# Patient Record
Sex: Female | Born: 1955
Health system: Southern US, Community
[De-identification: ages and names within clinical notes are randomized; demographics above are authoritative.]

## PROBLEM LIST (undated history)

## (undated) DIAGNOSIS — H02831 Dermatochalasis of right upper eyelid: Secondary | ICD-10-CM

## (undated) DIAGNOSIS — F17200 Nicotine dependence, unspecified, uncomplicated: Secondary | ICD-10-CM

## (undated) DIAGNOSIS — H02834 Dermatochalasis of left upper eyelid: Secondary | ICD-10-CM

## (undated) DIAGNOSIS — H401131 Primary open-angle glaucoma, bilateral, mild stage: Secondary | ICD-10-CM

## (undated) HISTORY — DX: Primary open-angle glaucoma, bilateral, mild stage: H40.1131

## (undated) HISTORY — DX: Dermatochalasis of left upper eyelid: H02.834

## (undated) HISTORY — DX: Nicotine dependence, unspecified, uncomplicated: F17.200

## (undated) HISTORY — DX: Dermatochalasis of right upper eyelid: H02.831

---

## 2000-01-01 HISTORY — PX: ANTERIOR CERVICAL DISCECTOMY: SHX1160

## 2017-02-01 DIAGNOSIS — Z969 Presence of functional implant, unspecified: Secondary | ICD-10-CM | POA: Diagnosis not present

## 2017-02-01 DIAGNOSIS — Z8781 Personal history of (healed) traumatic fracture: Secondary | ICD-10-CM | POA: Diagnosis not present

## 2017-02-01 DIAGNOSIS — M25561 Pain in right knee: Secondary | ICD-10-CM | POA: Diagnosis not present

## 2017-02-06 DIAGNOSIS — H52223 Regular astigmatism, bilateral: Secondary | ICD-10-CM | POA: Diagnosis not present

## 2017-02-06 DIAGNOSIS — H5203 Hypermetropia, bilateral: Secondary | ICD-10-CM | POA: Diagnosis not present

## 2017-08-13 DIAGNOSIS — K080 Exfoliation of teeth due to systemic causes: Secondary | ICD-10-CM | POA: Diagnosis not present

## 2017-08-20 DIAGNOSIS — K080 Exfoliation of teeth due to systemic causes: Secondary | ICD-10-CM | POA: Diagnosis not present

## 2017-10-04 DIAGNOSIS — H401111 Primary open-angle glaucoma, right eye, mild stage: Secondary | ICD-10-CM | POA: Diagnosis not present

## 2017-10-23 DIAGNOSIS — K080 Exfoliation of teeth due to systemic causes: Secondary | ICD-10-CM | POA: Diagnosis not present

## 2017-10-28 DIAGNOSIS — K080 Exfoliation of teeth due to systemic causes: Secondary | ICD-10-CM | POA: Diagnosis not present

## 2017-11-11 DIAGNOSIS — K080 Exfoliation of teeth due to systemic causes: Secondary | ICD-10-CM | POA: Diagnosis not present

## 2017-11-15 DIAGNOSIS — H401111 Primary open-angle glaucoma, right eye, mild stage: Secondary | ICD-10-CM | POA: Diagnosis not present

## 2017-11-19 DIAGNOSIS — K080 Exfoliation of teeth due to systemic causes: Secondary | ICD-10-CM | POA: Diagnosis not present

## 2018-01-08 DIAGNOSIS — K080 Exfoliation of teeth due to systemic causes: Secondary | ICD-10-CM | POA: Diagnosis not present

## 2018-01-28 DIAGNOSIS — K080 Exfoliation of teeth due to systemic causes: Secondary | ICD-10-CM | POA: Diagnosis not present

## 2018-02-05 DIAGNOSIS — H401111 Primary open-angle glaucoma, right eye, mild stage: Secondary | ICD-10-CM | POA: Diagnosis not present

## 2018-03-12 DIAGNOSIS — K080 Exfoliation of teeth due to systemic causes: Secondary | ICD-10-CM | POA: Diagnosis not present

## 2018-03-18 DIAGNOSIS — H401111 Primary open-angle glaucoma, right eye, mild stage: Secondary | ICD-10-CM | POA: Diagnosis not present

## 2018-04-09 DIAGNOSIS — H02834 Dermatochalasis of left upper eyelid: Secondary | ICD-10-CM | POA: Diagnosis not present

## 2018-04-09 DIAGNOSIS — H2513 Age-related nuclear cataract, bilateral: Secondary | ICD-10-CM | POA: Diagnosis not present

## 2018-04-09 DIAGNOSIS — H02831 Dermatochalasis of right upper eyelid: Secondary | ICD-10-CM

## 2018-04-09 DIAGNOSIS — K080 Exfoliation of teeth due to systemic causes: Secondary | ICD-10-CM | POA: Diagnosis not present

## 2018-04-09 DIAGNOSIS — H401131 Primary open-angle glaucoma, bilateral, mild stage: Secondary | ICD-10-CM

## 2018-04-09 HISTORY — DX: Dermatochalasis of right upper eyelid: H02.831

## 2018-04-09 HISTORY — DX: Primary open-angle glaucoma, bilateral, mild stage: H40.1131

## 2018-06-10 DIAGNOSIS — H02403 Unspecified ptosis of bilateral eyelids: Secondary | ICD-10-CM | POA: Diagnosis not present

## 2018-06-10 DIAGNOSIS — H02834 Dermatochalasis of left upper eyelid: Secondary | ICD-10-CM | POA: Diagnosis not present

## 2018-06-10 DIAGNOSIS — H02831 Dermatochalasis of right upper eyelid: Secondary | ICD-10-CM | POA: Diagnosis not present

## 2018-07-16 DIAGNOSIS — K080 Exfoliation of teeth due to systemic causes: Secondary | ICD-10-CM | POA: Diagnosis not present

## 2018-07-30 ENCOUNTER — Encounter: Payer: Self-pay | Admitting: Family Medicine

## 2018-07-30 ENCOUNTER — Ambulatory Visit (INDEPENDENT_AMBULATORY_CARE_PROVIDER_SITE_OTHER): Payer: Federal, State, Local not specified - PPO | Admitting: Family Medicine

## 2018-07-30 VITALS — BP 130/78 | HR 83 | Temp 98.3°F | Wt 156.6 lb

## 2018-07-30 DIAGNOSIS — F17210 Nicotine dependence, cigarettes, uncomplicated: Secondary | ICD-10-CM

## 2018-07-30 DIAGNOSIS — R490 Dysphonia: Secondary | ICD-10-CM | POA: Diagnosis not present

## 2018-07-30 DIAGNOSIS — F17200 Nicotine dependence, unspecified, uncomplicated: Secondary | ICD-10-CM | POA: Insufficient documentation

## 2018-07-30 HISTORY — DX: Nicotine dependence, unspecified, uncomplicated: F17.200

## 2018-07-30 MED ORDER — PREDNISONE 20 MG PO TABS: 40.0000 mg | ORAL_TABLET | Freq: Every day | ORAL | 0 refills | Status: AC

## 2018-07-30 NOTE — Assessment & Plan Note (Signed)
Down to 5 cigs per day, is also vaping.  Counseled on quitting

## 2018-07-30 NOTE — Progress Notes (Signed)
Debra Pena - 62 y.o. female MRN [id number]  Date of birth: July 18, 1956  Subjective Chief Complaint  Patient presents with  . Hoarse    last thursday became hoarse, not pain or sickness    HPI Debra Pena is a 62 y.o. female who is fairly healthy here today with complaint of hoarseness.  She reports onset of symptoms around 7-10 days ago.  Symptoms were fairly sudden in onset.  She denies any preceding or current illness.  She has not had any reflux symptoms or problems with allergies/congestion.  She does have a new grandchild that she has been around and when the baby screams or coos she will try to do it louder as kind of a game they play.  She is a smoker but has cut back to about 5 cigarettes per day, she also vapes which has helped her cut back on cigarette usage. She denies chronic cough associated with this, fever, chills or difficulty swallowing.   ROS:  A comprehensive ROS was completed and negative except as noted per HPI  No Known Allergies  Past Medical History:  Diagnosis Date  . Dermatochalasis of both upper eyelids 04/09/2018  . Nicotine dependence 07/30/2018  . Primary open angle glaucoma (POAG) of both eyes, mild stage 04/09/2018    Past Surgical History:  Procedure Laterality Date  . ANTERIOR CERVICAL DISCECTOMY  2001    Social History   Socioeconomic History  . Marital status: Married    Spouse name: Not on file  . Number of children: Not on file  . Years of education: Not on file  . Highest education level: Not on file  Occupational History  . Not on file  Social Needs  . Financial resource strain: Not on file  . Food insecurity:    Worry: Not on file    Inability: Not on file  . Transportation needs:    Medical: Not on file    Non-medical: Not on file  Tobacco Use  . Smoking status: Current Every Day Smoker    Packs/day: 0.25    Types: Cigarettes  . Smokeless tobacco: Never Used  Substance and Sexual Activity  . Alcohol use: Never   Frequency: Never  . Drug use: Never  . Sexual activity: Yes  Lifestyle  . Physical activity:    Days per week: Not on file    Minutes per session: Not on file  . Stress: Not on file  Relationships  . Social connections:    Talks on phone: Not on file    Gets together: Not on file    Attends religious service: Not on file    Active member of club or organization: Not on file    Attends meetings of clubs or organizations: Not on file    Relationship status: Not on file  Other Topics Concern  . Not on file  Social History Narrative  . Not on file    Family History  Problem Relation Age of Onset  . COPD Mother   . Heart disease Mother   . Hypertension Mother   . Hypertension Father   . Hyperlipidemia Father   . Diabetes Father   . Depression Sister   . Cancer Brother        Prostate  . Hypertension Son   . Hypertension Sister     Health Maintenance  Topic Date Due  . Hepatitis C Screening  1956-10-30  . HIV Screening  02/10/1969  . PAP SMEAR  02/10/1975  . MAMMOGRAM  02/11/2004  . INFLUENZA VACCINE  07/31/2018  . COLONOSCOPY  07/08/2021  . TETANUS/TDAP  07/17/2027    ----------------------------------------------------------------------------------------------------------------------------------------------------------------------------------------------------------------- Physical Exam BP 130/78 (BP Location: Right Arm, Patient Position: Sitting, Cuff Size: Normal)   Pulse 83   Temp 98.3 F (36.8 C) (Oral)   Wt 156 lb 9.6 oz (71 kg)   SpO2 96%   Physical Exam  Constitutional: She is oriented to person, place, and time. She appears well-nourished. No distress.  HENT:  Head: Normocephalic and atraumatic.  Mouth/Throat: Oropharynx is clear and moist.  Mild uvular edema.   Eyes: No scleral icterus.  Neck: No thyromegaly present.  Cardiovascular: Normal rate, regular rhythm and normal heart sounds.  Pulmonary/Chest: Effort normal and breath sounds normal.    Lymphadenopathy:    She has no cervical adenopathy.  Neurological: She is alert and oriented to person, place, and time.  Skin: Skin is warm and dry. No rash noted.  Psychiatric: She has a normal mood and affect. Her behavior is normal.    ------------------------------------------------------------------------------------------------------------------------------------------------------------------------------------------------------------------- Assessment and Plan  Hoarseness Denies symptoms of reflux/allergies Trial of prednisone If not improving with this I recommend that she see ENT with history of chronic smoking.   She will also return at her convenience for CPE.   Nicotine dependence Down to 5 cigs per day, is also vaping.  Counseled on quitting

## 2018-07-30 NOTE — Assessment & Plan Note (Signed)
Denies symptoms of reflux/allergies Trial of prednisone If not improving with this I recommend that she see ENT with history of chronic smoking.   She will also return at her convenience for CPE.

## 2018-07-30 NOTE — Patient Instructions (Signed)
Hoarseness Hoarseness is any abnormal change in your voice.Hoarseness can make it difficult to speak. Your voice may sound raspy, breathy, or strained. Hoarseness is caused by a problem with the vocal cords. The vocal cords are two bands of tissue inside your voice box (larynx). When you speak, your vocal cords move back and forth to create sound. The surfaces of your vocal cords need to be smooth for your voice to sound clear. Swelling or lumps on the vocal cords can cause hoarseness. Common causes of vocal cord problems include:  Upper airway infection.  A long-term cough.  Straining or overusing your voice.  Smoking.  Allergies.  Vocal cord growths.  Stomach acids that flow up from your stomach and irritate your vocal cords (gastroesophageal reflux).  Follow these instructions at home: Watch your condition for any changes. To ease any discomfort that you feel:  Rest your voice. Do not whisper. Whispering can cause muscle strain.  Do not speak in a loud or harsh voice that makes your hoarseness worse.  Do not use any tobacco products, including cigarettes, chewing tobacco, or electronic cigarettes. If you need help quitting, ask your health care provider.  Avoid secondhand smoke.  Do not eat foods that give you heartburn. Heartburn can make gastroesophageal reflux worse.  Do not drink coffee.  Do not drink alcohol.  Drink enough fluids to keep your urine clear or pale yellow.  Use a humidifier if the air in your home is dry.  Contact a health care provider if:  You have hoarseness that lasts longer than 3 weeks.  You almost lose or completelylose your voice for longer than 3 days.  You have pain when you swallow or try to talk.  You feel a lump in your neck. Get help right away if:  You have trouble swallowing.  You feel as though you are choking when you swallow.  You cough up blood or vomit blood.  You have trouble breathing. This information is not  intended to replace advice given to you by your health care provider. Make sure you discuss any questions you have with your health care provider. Document Released: 11/30/2005 Document Revised: 05/24/2016 Document Reviewed: 12/08/2014 Elsevier Interactive Patient Education  2018 Elsevier Inc.  

## 2018-09-15 DIAGNOSIS — Z87891 Personal history of nicotine dependence: Secondary | ICD-10-CM | POA: Diagnosis not present

## 2018-09-15 DIAGNOSIS — H02834 Dermatochalasis of left upper eyelid: Secondary | ICD-10-CM | POA: Diagnosis not present

## 2018-09-15 DIAGNOSIS — H409 Unspecified glaucoma: Secondary | ICD-10-CM | POA: Diagnosis not present

## 2018-09-15 DIAGNOSIS — H02831 Dermatochalasis of right upper eyelid: Secondary | ICD-10-CM | POA: Diagnosis not present

## 2018-10-01 DIAGNOSIS — H02831 Dermatochalasis of right upper eyelid: Secondary | ICD-10-CM | POA: Diagnosis not present

## 2018-10-01 DIAGNOSIS — H401131 Primary open-angle glaucoma, bilateral, mild stage: Secondary | ICD-10-CM | POA: Diagnosis not present

## 2018-10-01 DIAGNOSIS — H2513 Age-related nuclear cataract, bilateral: Secondary | ICD-10-CM | POA: Diagnosis not present

## 2018-10-01 DIAGNOSIS — H02834 Dermatochalasis of left upper eyelid: Secondary | ICD-10-CM | POA: Diagnosis not present

## 2018-10-22 DIAGNOSIS — K080 Exfoliation of teeth due to systemic causes: Secondary | ICD-10-CM | POA: Diagnosis not present

## 2019-02-02 ENCOUNTER — Ambulatory Visit (INDEPENDENT_AMBULATORY_CARE_PROVIDER_SITE_OTHER): Payer: Federal, State, Local not specified - PPO

## 2019-02-02 ENCOUNTER — Ambulatory Visit (INDEPENDENT_AMBULATORY_CARE_PROVIDER_SITE_OTHER): Payer: Federal, State, Local not specified - PPO | Admitting: Family Medicine

## 2019-02-02 ENCOUNTER — Encounter: Payer: Self-pay | Admitting: Family Medicine

## 2019-02-02 VITALS — BP 138/84 | HR 82 | Temp 97.7°F | Ht 63.0 in | Wt 159.4 lb

## 2019-02-02 DIAGNOSIS — M5412 Radiculopathy, cervical region: Secondary | ICD-10-CM | POA: Diagnosis not present

## 2019-02-02 DIAGNOSIS — M542 Cervicalgia: Secondary | ICD-10-CM | POA: Diagnosis not present

## 2019-02-02 MED ORDER — METHYLPREDNISOLONE 4 MG PO TBPK: ORAL_TABLET | ORAL | 0 refills | Status: DC

## 2019-02-02 NOTE — Patient Instructions (Signed)
Cervical Radiculopathy    Cervical radiculopathy means that a nerve in the neck is pinched or bruised. This can cause pain or loss of feeling (numbness) that runs from your neck to your arm and fingers.  Follow these instructions at home:  Managing pain  · Take over-the-counter and prescription medicines only as told by your doctor.  · If directed, put ice on the injured or painful area.  ? Put ice in a plastic bag.  ? Place a towel between your skin and the bag.  ? Leave the ice on for 20 minutes, 2-3 times per day.  · If ice does not help, you can try using heat. Take a warm shower or warm bath, or use a heat pack as told by your doctor.  · You may try a gentle neck and shoulder massage.  Activity  · Rest as needed. Follow instructions from your doctor about any activities to avoid.  · Do exercises as told by your doctor or physical therapist.  General instructions  · If you were given a soft collar, wear it as told by your doctor.  · Use a flat pillow when you sleep.  · Keep all follow-up visits as told by your doctor. This is important.  Contact a doctor if:  · Your condition does not improve with treatment.  Get help right away if:  · Your pain gets worse and is not controlled with medicine.  · You lose feeling or feel weak in your hand, arm, face, or leg.  · You have a fever.  · You have a stiff neck.  · You cannot control when you poop or pee (have incontinence).  · You have trouble with walking, balance, or talking.  This information is not intended to replace advice given to you by your health care provider. Make sure you discuss any questions you have with your health care provider.  Document Released: 12/06/2011 Document Revised: 05/24/2016 Document Reviewed: 02/10/2015  Elsevier Interactive Patient Education © 2019 Elsevier Inc.

## 2019-02-02 NOTE — Assessment & Plan Note (Signed)
-  Medrol dosepak and referral to PT -Imaging ordered.

## 2019-02-02 NOTE — Progress Notes (Addendum)
Debra Pena - 63 y.o. female MRN [id number]  Date of birth: Aug 07, 1956  Subjective Chief Complaint  Patient presents with  . Pain    pain in right arm/ 4 days/ 20 years ago had herniated disc pt states feels like same pain    HPI Debra Pena is a 63 y.o. female here today with complaint of neck pain with radiation into the R arm with numbness and tingling to the first digit and thumb.  She report that this started about 4 days ago after lifting her grandson.  She reports that this feels similar to when she had disc herniation that required surgery 20 years ago.  She denies significant weakness.  She has not tried anything for treatment so far.   ROS:  A comprehensive ROS was completed and negative except as noted per HPI  No Known Allergies  Past Medical History:  Diagnosis Date  . Dermatochalasis of both upper eyelids 04/09/2018  . Nicotine dependence 07/30/2018  . Primary open angle glaucoma (POAG) of both eyes, mild stage 04/09/2018    Past Surgical History:  Procedure Laterality Date  . ANTERIOR CERVICAL DISCECTOMY  2001    Social History   Socioeconomic History  . Marital status: Married    Spouse name: Not on file  . Number of children: Not on file  . Years of education: Not on file  . Highest education level: Not on file  Occupational History  . Not on file  Social Needs  . Financial resource strain: Not on file  . Food insecurity:    Worry: Not on file    Inability: Not on file  . Transportation needs:    Medical: Not on file    Non-medical: Not on file  Tobacco Use  . Smoking status: Current Every Day Smoker    Packs/day: 0.25    Types: Cigarettes  . Smokeless tobacco: Never Used  Substance and Sexual Activity  . Alcohol use: Never    Frequency: Never  . Drug use: Never  . Sexual activity: Yes  Lifestyle  . Physical activity:    Days per week: Not on file    Minutes per session: Not on file  . Stress: Not on file  Relationships  . Social  connections:    Talks on phone: Not on file    Gets together: Not on file    Attends religious service: Not on file    Active member of club or organization: Not on file    Attends meetings of clubs or organizations: Not on file    Relationship status: Not on file  Other Topics Concern  . Not on file  Social History Narrative  . Not on file    Family History  Problem Relation Age of Onset  . COPD Mother   . Heart disease Mother   . Hypertension Mother   . Hypertension Father   . Hyperlipidemia Father   . Diabetes Father   . Depression Sister   . Cancer Brother        Prostate  . Hypertension Son   . Hypertension Sister     Health Maintenance  Topic Date Due  . Hepatitis C Screening  07/15/1956  . HIV Screening  02/10/1969  . PAP SMEAR-Modifier  02/10/1975  . MAMMOGRAM  02/11/2004  . COLONOSCOPY  07/08/2021  . TETANUS/TDAP  07/17/2027  . INFLUENZA VACCINE  Completed    ----------------------------------------------------------------------------------------------------------------------------------------------------------------------------------------------------------------- Physical Exam BP 138/84   Pulse 82   Temp 97.7 F (36.5 C) (  Oral)   Ht 5\' 3"  (1.6 m)   Wt 159 lb 6.4 oz (72.3 kg)   SpO2 97%   BMI 28.24 kg/m   Physical Exam Constitutional:      Appearance: Normal appearance.  HENT:     Head: Normocephalic and atraumatic.  Eyes:     General: No scleral icterus. Neck:     Comments: ROM limited with R rotation with +spurling on R Cardiovascular:     Rate and Rhythm: Normal rate and regular rhythm.  Pulmonary:     Effort: Pulmonary effort is normal.     Breath sounds: Normal breath sounds.  Skin:    General: Skin is warm and dry.     Findings: No rash.  Neurological:     Mental Status: She is alert.     Comments: Mild weakness of intrinsic muscles of thumb and first digit.  Otherwise normal.   Psychiatric:        Mood and Affect: Mood normal.         Behavior: Behavior normal.     ------------------------------------------------------------------------------------------------------------------------------------------------------------------------------------------------------------------- Assessment and Plan  Cervical radiculopathy -Medrol dosepak and referral to PT -Imaging ordered.

## 2019-02-03 ENCOUNTER — Ambulatory Visit: Payer: Federal, State, Local not specified - PPO | Admitting: Physical Therapy

## 2019-02-03 ENCOUNTER — Other Ambulatory Visit: Payer: Self-pay

## 2019-02-03 ENCOUNTER — Encounter: Payer: Self-pay | Admitting: Physical Therapy

## 2019-02-03 DIAGNOSIS — R293 Abnormal posture: Secondary | ICD-10-CM

## 2019-02-03 DIAGNOSIS — M5412 Radiculopathy, cervical region: Secondary | ICD-10-CM

## 2019-02-03 NOTE — Therapy (Signed)
[facility] [address] [place], [place], [address] Phone: [phone]   Fax:  [phone]  Physical Therapy Evaluation  Patient Details  Name: Debra Pena MRN: [id number] Date of Birth: 05-20-1956 Referring Provider (PT): Everrett CoombeMatthews, Cody, DO   Encounter Date: 02/03/2019  PT End of Session - 02/03/19 1154    Visit Number  1    Number of Visits  12    Date for PT Re-Evaluation  03/17/19    Authorization Type  [employer]    Authorization - Number of Visits  75    PT Start Time  1115    PT Stop Time  1150    PT Time Calculation (min)  35 min    Activity Tolerance  Patient tolerated treatment well    Behavior During Therapy  [facility] for tasks assessed/performed       Past Medical History:  Diagnosis Date  . Dermatochalasis of both upper eyelids 04/09/2018  . Nicotine dependence 07/30/2018  . Primary open angle glaucoma (POAG) of both eyes, mild stage 04/09/2018    Past Surgical History:  Procedure Laterality Date  . ANTERIOR CERVICAL DISCECTOMY  2001    There were no vitals filed for this visit.   Subjective Assessment - 02/03/19 1117    Subjective  Pt is a 63 y/o female who presents to OPPT with sudden onset of Rt sided neck pain radiating into Rt hand, similar to pain experienced with previous herniated disc.  Pt feels symptoms may be due to lifting 25# grandson.      Pertinent History  surgical repair of herniated cervical disc (2000)    Diagnostic tests  xrays: arthritis    Patient Stated Goals  improve pain, work on strength/proper lifting techniques    Currently in Pain?  Yes    Pain Score  0-No pain   up to 8/10   Pain Location  Neck    Pain Orientation  Right;Posterior    Pain Descriptors / Indicators  Shooting;Tingling    Pain Type  Acute pain    Pain Radiating Towards  Rt UE; some numbness into pointer fingertip    Pain Onset  In the past 7 days    Pain Frequency  Intermittent    Aggravating  Factors   rt rotation, lying on Lt side    Pain Relieving Factors  prednisone, aleve, ibprofen, heat/ice         [facility] - 02/03/19 1121      Assessment   Medical Diagnosis  M54.12 (ICD-10-CM) - Cervical radiculopathy    Referring Provider (PT)  Everrett CoombeMatthews, Cody, DO    Onset Date/Surgical Date  01/29/19    Hand Dominance  Right    Next MD Visit  return if no improvement    Prior Therapy  none      Precautions   Precautions  None      Restrictions   Weight Bearing Restrictions  No      Balance Screen   Has the patient fallen in the past 6 months  No    Has the patient had a decrease in activity level because of a fear of falling?   No    Is the patient reluctant to leave their home because of a fear of falling?   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    Leisure  knitting, English as a second language teacher, spend time with grandchildren (65 y/o and 1 y/o); no regular exercise      Cognition   Overall Cognitive Status  Within Functional Limits for tasks assessed      Observation/Other Assessments   Focus on Therapeutic Outcomes (FOTO)   58 (42% limited; predicted 29% limited)      Posture/Postural Control   Posture/Postural Control  Postural limitations    Postural Limitations  Rounded Shoulders;Forward head;Increased thoracic kyphosis      ROM / Strength   AROM / PROM / Strength  AROM;Strength      AROM   AROM Assessment Site  Cervical    Cervical Flexion  53    Cervical Extension  35    Cervical - Right Side Bend  24   with pain   Cervical - Left Side Bend  32    Cervical - Right Rotation  42   min pain   Cervical - Left Rotation  62      Strength    Overall Strength Comments  no pain with strength testing    Strength Assessment Site  Shoulder;Elbow    Right/Left Shoulder  Right;Left    Right Shoulder Flexion  5/5    Right Shoulder ABduction  5/5    Right Shoulder Internal Rotation  5/5    Right Shoulder External Rotation  5/5    Left Shoulder Flexion  5/5    Left Shoulder ABduction  5/5    Left Shoulder Internal Rotation  5/5    Left Shoulder External Rotation  5/5    Right/Left Elbow  Right;Left    Right Elbow Flexion  5/5    Right Elbow Extension  5/5    Left Elbow Flexion  5/5    Left Elbow Extension  5/5      Palpation   Palpation comment  trigger points and tenderness in Rt upper trap and levator scapula      Special Tests    Special Tests  Cervical    Cervical Tests  Spurling's;Dictraction      Spurling's   Findings  Negative      Distraction Test   Findngs  Negative                Objective measurements completed on examination: See above findings.      [facility] Adult PT Treatment/Exercise - 02/03/19 1121      Exercises   Exercises  Neck      Neck Exercises: Seated   Other Seated Exercise  scapular retraction x 5 rep      Neck Exercises: Supine   Neck Retraction  5 reps;5 secs      Neck Exercises: Stretches   Other Neck Stretches  mid level doorway stretch x 30 sec             PT Education - 02/03/19 1154    Education Details  HEP, DN    Person(s) Educated  Patient    Methods  Explanation;Demonstration;Handout    Comprehension  Verbalized understanding;Need further instruction;Returned demonstration          PT Long Term Goals - 02/03/19 1312      PT LONG TERM GOAL #1  Title  independent with HEP    Status  New    Target Date  03/17/19      PT LONG TERM GOAL #2   Title  report centralization of symptoms for improved function    Status  New    Target Date  03/17/19      PT LONG TERM GOAL #3   Title  improve Rt cervical rotation to at least 55 degrees without pain for  improved function    Status  New    Target Date  03/17/19      PT LONG TERM GOAL #4   Title  FOTO score improved to </= 30% limited for improved function    Status  New    Target Date  03/17/19      PT LONG TERM GOAL #5   Title  demonstrate improved postural awareness for improved function and decreased risk of reinjury    Status  New    Target Date  03/17/19             Plan - 02/03/19 1259    Clinical Impression Statement  Pt is a 63 y/o female who presents to OPPT for acute onset of Rt neck pain radiating into RUE.  Pt demonstrates decreased ROM, poor postural awareness and active trigger points affecting functional mobility.  Pt will benefit from PT to address deficits listed.    History and Personal Factors relevant to plan of care:  hx cervical decompression    Clinical Presentation  Stable    Clinical Decision Making  Low    Rehab Potential  Good    PT Frequency  2x / week    PT Duration  6 weeks    PT Treatment/Interventions  ADLs/Self Care Home Management;Cryotherapy;Ultrasound;Traction;Moist Heat;Electrical Stimulation;Functional mobility training;Neuromuscular re-education;Therapeutic exercise;Therapeutic activities;Patient/family education;Manual techniques;Dry needling;Passive range of motion;Taping    PT Next Visit Plan  review HEP, try traction, consider DN/manual therapy, posture exercises    PT Home Exercise Plan  Access Code: [email or web address]    Consulted and Agree with Plan of Care  Patient       Patient will benefit from skilled therapeutic intervention in order to improve the following deficits and impairments:  Postural dysfunction, Impaired UE functional use, Decreased range of motion, Increased muscle spasms, Increased fascial restricitons  Visit Diagnosis: Radiculopathy, cervical region - Plan: PT plan of care cert/re-cert  Abnormal posture - Plan: PT plan of care cert/re-cert     Problem List Patient Active Problem List   Diagnosis Date Noted  .  Cervical radiculopathy 02/02/2019  . Nicotine dependence 07/30/2018  . Hoarseness 07/30/2018  . Age-related nuclear cataract of both eyes 04/09/2018  . Dermatochalasis of both upper eyelids 04/09/2018  . Primary open angle glaucoma (POAG) of both eyes, mild stage 04/09/2018      Clarita Crane, PT, DPT 02/03/19 1:16 PM    [facility] [address] [place], [place], [address] Phone: [phone]   Fax:  [phone]  Name: Debra Pena MRN: [id number] Date of Birth: 07/02/1956

## 2019-02-03 NOTE — Patient Instructions (Signed)
Access Code: [email or web address]  URL: [email or web address]  Date: 02/03/2019  Prepared by: Moshe Cipro   Exercises  Supine Chin Tuck - 10 reps - 1 sets - 5 sec hold - 2x daily - 7x weekly  Doorway Pec Stretch at 90 Degrees Abduction - 3 reps - 1 sets - 30 sec hold - 1x daily - 7x weekly  Seated Scapular Retraction - 10 reps - 1 sets - 5 sec hold - 2x daily - 7x weekly  Patient Education  Acute Neck Pain Handout  Trigger Point Dry Needling

## 2019-02-05 DIAGNOSIS — K080 Exfoliation of teeth due to systemic causes: Secondary | ICD-10-CM | POA: Diagnosis not present

## 2019-02-06 ENCOUNTER — Encounter: Payer: Self-pay | Admitting: Physical Therapy

## 2019-02-06 ENCOUNTER — Ambulatory Visit: Payer: Federal, State, Local not specified - PPO | Admitting: Physical Therapy

## 2019-02-06 DIAGNOSIS — R293 Abnormal posture: Secondary | ICD-10-CM

## 2019-02-06 DIAGNOSIS — M5412 Radiculopathy, cervical region: Secondary | ICD-10-CM | POA: Diagnosis not present

## 2019-02-06 NOTE — Therapy (Signed)
[place], [place], [address] Phone: [phone]   Fax:  [phone]  Physical Therapy Treatment  Patient Details  Name: Debra Pena MRN: [id number] Date of Birth: 1956/04/21 Referring Provider (PT): Luetta Nutting, DO   Encounter Date: 02/06/2019  PT End of Session - 02/06/19 0929    Visit Number  2    Number of Visits  12    Date for PT Re-Evaluation  03/17/19    Authorization Type  [employer] - Number of Visits  24    PT Start Time  0845    PT Stop Time  0936    PT Time Calculation (min)  51 min    Activity Tolerance  Patient tolerated treatment well    Behavior During Therapy  [facility] for tasks assessed/performed       Past Medical History:  Diagnosis Date  . Dermatochalasis of both upper eyelids 04/09/2018  . Nicotine dependence 07/30/2018  . Primary open angle glaucoma (POAG) of both eyes, mild stage 04/09/2018    Past Surgical History:  Procedure Laterality Date  . ANTERIOR CERVICAL DISCECTOMY  2001    There were no vitals filed for this visit.  Subjective Assessment - 02/06/19 0845    Subjective  exercises going well; has days when neck feels better; then days when it feels a little worse    Pertinent History  surgical repair of herniated cervical disc (2000)    Patient Stated Goals  improve pain, work on strength/proper lifting techniques    Currently in Pain?  Yes    Pain Score  2     Pain Location  Arm    Pain Orientation  Upper;Posterior;Right    Pain Descriptors / Indicators  Burning;Sore    Pain Type  Neuropathic pain    Pain Onset  In the past 7 days    Pain Frequency  Intermittent    Aggravating Factors   rt rotation, lying on Lt side    Pain Relieving Factors  prednisone, aleve, ibuprofen, heat/ice         [facility] PT Assessment - 02/06/19 0859      Assessment   Medical Diagnosis  M54.12 (ICD-10-CM) - Cervical radiculopathy    Referring Provider  (PT)  Luetta Nutting, DO    Onset Date/Surgical Date  01/29/19                   [facility] Adult PT Treatment/Exercise - 02/06/19 0849      Neck Exercises: Machines for Strengthening   UBE (Upper Arm Bike)  L2 x 4 min (2' each direction)      Neck Exercises: Seated   Other Seated Exercise  scapular retraction 10 x 5 sec      Neck Exercises: Supine   Neck Retraction  10 reps;5 secs      Modalities   Modalities  Traction      Traction   Type of Traction  Cervical    Min (lbs)  10    Max (lbs)  15    Hold Time  60    Rest Time  20    Time  15      Manual Therapy   Manual Therapy  Soft tissue mobilization    Manual therapy comments  skilled palpation and monitoring of soft tissue during DN    Soft tissue mobilization  Rt upper trap into levator and cervical paraspinals; sub occipital release  Neck Exercises: Stretches   Other Neck Stretches  low and mid level doorway stretch 2x30 sec each       Trigger Point Dry Needling - 02/06/19 0928    Consent Given?  Yes    Education Handout Provided  Yes    Muscles Treated Upper Body  Upper trapezius    Upper Trapezius Response  Twitch reponse elicited;Palpable increased muscle length                PT Long Term Goals - 02/03/19 1312      PT LONG TERM GOAL #1   Title  independent with HEP    Status  New    Target Date  03/17/19      PT LONG TERM GOAL #2   Title  report centralization of symptoms for improved function    Status  New    Target Date  03/17/19      PT LONG TERM GOAL #3   Title  improve Rt cervical rotation to at least 55 degrees without pain for improved function    Status  New    Target Date  03/17/19      PT LONG TERM GOAL #4   Title  FOTO score improved to </= 30% limited for improved function    Status  New    Target Date  03/17/19      PT LONG TERM GOAL #5   Title  demonstrate improved postural awareness for improved function and decreased risk of reinjury    Status  New     Target Date  03/17/19            Plan - 02/06/19 0931    Clinical Impression Statement  Pt demonstrated independence with HEP and reports intermittent symptoms with improvement at times, then increase in symptoms at other times without known cause.  DN and traction trialed today to see if pt notices centralization or improvement in symptoms.  Will continue to benefit from PT to maximize function.  No goals met as only 2nd visit.    Rehab Potential  Good    PT Frequency  2x / week    PT Duration  6 weeks    PT Treatment/Interventions  ADLs/Self Care Home Management;Cryotherapy;Ultrasound;Traction;Moist Heat;Electrical Stimulation;Functional mobility training;Neuromuscular re-education;Therapeutic exercise;Therapeutic activities;Patient/family education;Manual techniques;Dry needling;Passive range of motion;Taping    PT Next Visit Plan  update HEP for additional posture exercises, assess response to tratction/DN/manual therapy and continue as indicated, posture exercises    PT Home Exercise Plan  Access Code: [email or web address]    Consulted and Agree with Plan of Care  Patient       Patient will benefit from skilled therapeutic intervention in order to improve the following deficits and impairments:  Postural dysfunction, Impaired UE functional use, Decreased range of motion, Increased muscle spasms, Increased fascial restricitons  Visit Diagnosis: Radiculopathy, cervical region  Abnormal posture     Problem List Patient Active Problem List   Diagnosis Date Noted  . Cervical radiculopathy 02/02/2019  . Nicotine dependence 07/30/2018  . Hoarseness 07/30/2018  . Age-related nuclear cataract of both eyes 04/09/2018  . Dermatochalasis of both upper eyelids 04/09/2018  . Primary open angle glaucoma (POAG) of both eyes, mild stage 04/09/2018      Laureen Abrahams, PT, DPT 02/06/19 9:34 AM    [facility] [place], [place], [address] Phone: [phone]   Fax:  [phone]  Name: Jesus Poplin MRN: [id number] Date of  Birth: 1956-08-22

## 2019-02-10 ENCOUNTER — Telehealth: Payer: Self-pay | Admitting: Family Medicine

## 2019-02-10 ENCOUNTER — Encounter: Payer: Self-pay | Admitting: Physical Therapy

## 2019-02-10 ENCOUNTER — Ambulatory Visit: Payer: Federal, State, Local not specified - PPO | Admitting: Physical Therapy

## 2019-02-10 ENCOUNTER — Other Ambulatory Visit: Payer: Self-pay | Admitting: Family Medicine

## 2019-02-10 DIAGNOSIS — R293 Abnormal posture: Secondary | ICD-10-CM

## 2019-02-10 DIAGNOSIS — M5412 Radiculopathy, cervical region: Secondary | ICD-10-CM

## 2019-02-10 DIAGNOSIS — M5000 Cervical disc disorder with myelopathy, unspecified cervical region: Secondary | ICD-10-CM

## 2019-02-10 NOTE — Progress Notes (Signed)
Thanks for the update!.  I have ordered an MRI for her since she is having progressive symptoms.

## 2019-02-10 NOTE — Telephone Encounter (Signed)
I would recommend proceeding with MRI at this point given progressive symptoms.  MRI ordered please proceed with PA if needed.

## 2019-02-10 NOTE — Therapy (Addendum)
[place], [place], [address] Phone: [phone]   Fax:  [phone]  Physical Therapy Treatment/Discharge  Patient Details  Name: Debra Pena MRN: [id number] Date of Birth: 1956-02-22 Referring Provider (PT): Luetta Nutting, DO   Encounter Date: 02/10/2019  PT End of Session - 02/10/19 1011    Visit Number  3    Number of Visits  12    Date for PT Re-Evaluation  03/17/19    Authorization Type  [employer] - Number of Visits  75    PT Start Time  0930    PT Stop Time  1000    PT Time Calculation (min)  30 min    Activity Tolerance  Patient tolerated treatment well;Treatment limited secondary to medical complications (Comment)    Behavior During Therapy  [facility] for tasks assessed/performed       Past Medical History:  Diagnosis Date  . Dermatochalasis of both upper eyelids 04/09/2018  . Nicotine dependence 07/30/2018  . Primary open angle glaucoma (POAG) of both eyes, mild stage 04/09/2018    Past Surgical History:  Procedure Laterality Date  . ANTERIOR CERVICAL DISCECTOMY  2001    There were no vitals filed for this visit.  Subjective Assessment - 02/10/19 0931    Subjective  feels symptoms are about the same.  felt better for a few days then pain returned.  very position dependent with Rt rotation.    Pertinent History  surgical repair of herniated cervical disc (2000)    Patient Stated Goals  improve pain, work on strength/proper lifting techniques    Currently in Pain?  No/denies         [facility] PT Assessment - 02/10/19 0001      Assessment   Medical Diagnosis  M54.12 (ICD-10-CM) - Cervical radiculopathy    Referring Provider (PT)  Luetta Nutting, DO    Onset Date/Surgical Date  01/29/19      Strength   Strength Assessment Site  Hand    Right Elbow Extension  3-/5    Right/Left hand  Right;Left    Right Hand Grip (lbs)  49.67   55, 49. 45   Left Hand Grip  (lbs)  46   50, 43, 45                  [facility] Adult PT Treatment/Exercise - 02/10/19 0933      Self-Care   Self-Care  Other Self-Care Comments    Other Self-Care Comments   discussed change in elbow extension strength and concerns with pt.  advised to call MD and noted PT would send today's note to MD.        Neck Exercises: Machines for Strengthening   UBE (Upper Arm Bike)  L2 x 4 min (2' each direction)      Neck Exercises: Theraband   Scapula Retraction  15 reps;Green    Scapula Retraction Limitations  5 sec hold    Shoulder Extension  15 reps;Green    Shoulder Extension Limitations  5 sec hold    Horizontal ABduction  15 reps;Red    Horizontal ABduction Limitations  supine    Other Theraband Exercises  overhead pull x15 with red theraband; stopped exercise early due to difficulty with maintaining elbow extension with red theraband (see assessment)             PT Education - 02/10/19 1011    Education Details  see self care  Person(s) Educated  Patient    Methods  Explanation    Comprehension  Verbalized understanding          PT Long Term Goals - 02/03/19 1312      PT LONG TERM GOAL #1   Title  independent with HEP    Status  New    Target Date  03/17/19      PT LONG TERM GOAL #2   Title  report centralization of symptoms for improved function    Status  New    Target Date  03/17/19      PT LONG TERM GOAL #3   Title  improve Rt cervical rotation to at least 55 degrees without pain for improved function    Status  New    Target Date  03/17/19      PT LONG TERM GOAL #4   Title  FOTO score improved to </= 30% limited for improved function    Status  New    Target Date  03/17/19      PT LONG TERM GOAL #5   Title  demonstrate improved postural awareness for improved function and decreased risk of reinjury    Status  New    Target Date  03/17/19            Plan - 02/10/19 1011    Clinical Impression Statement  Pt with recent  change in tricep strength, now 3-/5 compared to 5/5 at initial evaluation.  Recommended pt follow up with MD given changes.  Reviewed red flags and when to seek medical attention immediately if other symptoms develop.  Plan to see what MD says and may need to hold or d/c from PT at this time.    Rehab Potential  Good    PT Frequency  2x / week    PT Duration  6 weeks    PT Treatment/Interventions  ADLs/Self Care Home Management;Cryotherapy;Ultrasound;Traction;Moist Heat;Electrical Stimulation;Functional mobility training;Neuromuscular re-education;Therapeutic exercise;Therapeutic activities;Patient/family education;Manual techniques;Dry needling;Passive range of motion;Taping    PT Next Visit Plan  update HEP for additional posture exercises, assess response to tratction/DN/manual therapy and continue as indicated, posture exercises; see what MD says (may need hold)    PT Home Exercise Plan  Access Code: [email or web address]    Consulted and Agree with Plan of Care  Patient       Patient will benefit from skilled therapeutic intervention in order to improve the following deficits and impairments:  Postural dysfunction, Impaired UE functional use, Decreased range of motion, Increased muscle spasms, Increased fascial restricitons  Visit Diagnosis: Radiculopathy, cervical region  Abnormal posture     Problem List Patient Active Problem List   Diagnosis Date Noted  . Cervical radiculopathy 02/02/2019  . Nicotine dependence 07/30/2018  . Hoarseness 07/30/2018  . Age-related nuclear cataract of both eyes 04/09/2018  . Dermatochalasis of both upper eyelids 04/09/2018  . Primary open angle glaucoma (POAG) of both eyes, mild stage 04/09/2018     Laureen Abrahams, PT, DPT 02/10/19 10:16 AM    [place], [place], [address] Phone: [phone]   Fax:  [phone]  Name: Debra Pena MRN: [id number] Date of  Birth: 12-25-1956     PHYSICAL THERAPY DISCHARGE SUMMARY  Visits from Start of Care: 3  Current functional level related to goals / functional outcomes: See above   Remaining deficits: See above; pt referred for MRI, and following MRI referred to neurosurgery.   Education / Equipment: HEP  Plan: Patient agrees to discharge.  Patient goals were not met. Patient is being discharged due to a change in medical status.  ?????    Laureen Abrahams, PT, DPT 03/03/19 2:20 PM  [facility] [place], [place] [address]  [phone] (office) [phone] (fax)

## 2019-02-10 NOTE — Telephone Encounter (Signed)
Copied from CRM [id number]. Topic: Quick Communication - See Telephone Encounter >> Feb 10, 2019 10:11 AM Arlyss Gandy, NT wrote: CRM for notification. See Telephone encounter for: 02/10/19. Pt states she went to see her PT this morning and the PT stated there has been a significant change in the strength of her triceps in her right arm. She instructed the pt to reach out to Dr. Ashley Royalty to see what the next steps should be.

## 2019-02-11 ENCOUNTER — Other Ambulatory Visit: Payer: Self-pay | Admitting: Family Medicine

## 2019-02-11 MED ORDER — METHYLPREDNISOLONE 4 MG PO TBPK: ORAL_TABLET | ORAL | 0 refills | Status: AC

## 2019-02-11 NOTE — Telephone Encounter (Signed)
Patient notified verbalized understanding

## 2019-02-11 NOTE — Telephone Encounter (Signed)
Patient notified-wanted to know if she needs a refill on prednisone-still experiencing tingling and numbing.

## 2019-02-11 NOTE — Telephone Encounter (Signed)
Will repeat course x1, not something I want her on long term though.

## 2019-02-13 ENCOUNTER — Encounter: Payer: Self-pay | Admitting: Physical Therapy

## 2019-02-17 ENCOUNTER — Telehealth: Payer: Self-pay

## 2019-02-17 NOTE — Telephone Encounter (Signed)
Debra Pena -see message below °

## 2019-02-17 NOTE — Telephone Encounter (Signed)
Copied from CRM [id number]. Topic: General - Other >> Feb 17, 2019  3:13 PM Percival Spanish wrote:  Myriam Jacobson with [facility] call to ask if pt is suppose to  have the MRI at there location or somewhere else    [phone]

## 2019-03-02 ENCOUNTER — Other Ambulatory Visit: Payer: Self-pay | Admitting: Family Medicine

## 2019-03-02 ENCOUNTER — Ambulatory Visit (INDEPENDENT_AMBULATORY_CARE_PROVIDER_SITE_OTHER): Payer: Federal, State, Local not specified - PPO

## 2019-03-02 DIAGNOSIS — M5000 Cervical disc disorder with myelopathy, unspecified cervical region: Secondary | ICD-10-CM

## 2019-03-02 DIAGNOSIS — M50022 Cervical disc disorder at C5-C6 level with myelopathy: Secondary | ICD-10-CM

## 2019-03-02 DIAGNOSIS — M4803 Spinal stenosis, cervicothoracic region: Secondary | ICD-10-CM | POA: Diagnosis not present

## 2019-03-02 DIAGNOSIS — M50223 Other cervical disc displacement at C6-C7 level: Secondary | ICD-10-CM | POA: Diagnosis not present

## 2019-03-02 DIAGNOSIS — M4802 Spinal stenosis, cervical region: Secondary | ICD-10-CM

## 2019-03-12 ENCOUNTER — Encounter: Payer: Self-pay | Admitting: Family Medicine

## 2019-04-28 DIAGNOSIS — M5412 Radiculopathy, cervical region: Secondary | ICD-10-CM | POA: Diagnosis not present

## 2019-04-28 DIAGNOSIS — M4802 Spinal stenosis, cervical region: Secondary | ICD-10-CM | POA: Diagnosis not present

## 2019-04-28 DIAGNOSIS — M5030 Other cervical disc degeneration, unspecified cervical region: Secondary | ICD-10-CM | POA: Diagnosis not present

## 2019-04-29 ENCOUNTER — Encounter: Payer: Self-pay | Admitting: Family Medicine

## 2019-04-29 DIAGNOSIS — M50022 Cervical disc disorder at C5-C6 level with myelopathy: Secondary | ICD-10-CM

## 2019-04-29 NOTE — Telephone Encounter (Signed)
Pt was cleared by Spine specialist to return back to Physical Therapist visit. Placing new referral to [facility] PT , where Pt has been before. Auth verbal per Dr. Ashley Royalty.

## 2019-05-06 ENCOUNTER — Ambulatory Visit (INDEPENDENT_AMBULATORY_CARE_PROVIDER_SITE_OTHER): Payer: Federal, State, Local not specified - PPO | Admitting: Rehabilitative and Restorative Service Providers"

## 2019-05-06 ENCOUNTER — Other Ambulatory Visit: Payer: Self-pay

## 2019-05-06 ENCOUNTER — Encounter: Payer: Self-pay | Admitting: Rehabilitative and Restorative Service Providers"

## 2019-05-06 DIAGNOSIS — R293 Abnormal posture: Secondary | ICD-10-CM | POA: Diagnosis not present

## 2019-05-06 DIAGNOSIS — M5412 Radiculopathy, cervical region: Secondary | ICD-10-CM | POA: Diagnosis not present

## 2019-05-06 NOTE — Patient Instructions (Addendum)
Access Code: [facility]  URL: [email or web address]  Date: 05/06/2019  Prepared by: Corlis Leak   Exercises  Seated Cervical Retraction - 10 reps - 1 sets - 3x daily - 7x weekly  Standing Scapular Retraction - 10 reps - 1 sets - 10 hold - 3x daily - 7x weekly  Shoulder External Rotation and Scapular Retraction - 10 reps - 1 sets - hold - 3x daily - 7x weekly  Standing Scapular Retraction in Abduction - 10 reps - 1 sets - 3x daily - 7x weekly  Doorway Pec Stretch at 60 Degrees Abduction - 3 reps - 1 sets - 3x daily - 7x weekly  Doorway Pec Stretch at 90 Degrees Abduction - 3 reps - 1 sets - 30 seconds hold - 3x daily - 7x weekly  Doorway Pec Stretch at 120 Degrees Abduction - 3 reps - 1 sets - 30 second hold hold - 3x daily - 7x weekly  Tricep Push Up on Wall - 5-10 reps - 1 sets - 3-5 sec hold - 2x daily - 7x weekly   TENS UNIT: This is helpful for muscle pain and spasm.   Search and Purchase a TENS 7000 2nd edition at www.tenspros.com. It should be less than $30.     TENS unit instructions: Do not shower or bathe with the unit on Turn the unit off before removing electrodes or batteries If the electrodes lose stickiness add a drop of water to the electrodes after they are disconnected from the unit and place on plastic sheet. If you continued to have difficulty, call the TENS unit company to purchase more electrodes. Do not apply lotion on the skin area prior to use. Make sure the skin is clean and dry as this will help prolong the life of the electrodes. After use, always check skin for unusual red areas, rash or other skin difficulties. If there are any skin problems, does not apply electrodes to the same area. Never remove the electrodes from the unit by pulling the wires. Do not use the TENS unit or electrodes other than as directed. Do not change electrode placement without consultating your therapist or physician. Keep 2 fingers with between each electrode. Wear time  ratio is 2:1, on to off times.    For example on for 30 minutes off for 15 minutes and then on for 30 minutes off for 15 minutes  Neurovascular: Median Nerve Stretch - Supine    Lie with neck supported, side-bent away from moving arm. Hold right arm out to side, elbow bent, thumb down, fingers and wrist bent back. Slowly straighten elbowas far as possible without pain. Hold for __60__ seconds. Repeat ___2_ times per set. Do 2x/day

## 2019-05-06 NOTE — Therapy (Signed)
[facility] [place] [address] [place], [place], [address] Phone: [phone]   Fax:  [phone]  Physical Therapy Evaluation  Patient Details  Name: Debra Pena MRN: [id number] Date of Birth: [birth year] Referring Provider (PT): Dr Everrett Coombe   Encounter Date: 05/06/2019  PT End of Session - 05/06/19 0902    Visit Number  1    Number of Visits  12    Date for PT Re-Evaluation  06/17/19    PT Start Time  0903    PT Stop Time  1000    PT Time Calculation (min)  57 min    Activity Tolerance  Patient tolerated treatment well       Past Medical History:  Diagnosis Date  . Dermatochalasis of both upper eyelids 04/09/2018  . Nicotine dependence 07/30/2018  . Primary open angle glaucoma (POAG) of both eyes, mild stage 04/09/2018    Past Surgical History:  Procedure Laterality Date  . ANTERIOR CERVICAL DISCECTOMY  2001    There were no vitals filed for this visit.   Subjective Assessment - 05/06/19 0905    Subjective  Patient reports that she had an MRI which showed DDD. She continues to notice weakness in the Rt UE. The tingling has resolved. She has not been doing any exercises until she returned to MD. Spine MD referred her back to PT.     Pertinent History  ceverical disc sx 2001; DDD; arthritis     Diagnostic tests  MRI    Patient Stated Goals  get stronger in Rt arm     Currently in Pain?  No/denies    Pain Descriptors / Indicators  Numbness    Pain Type  Chronic pain;Acute pain    Pain Radiating Towards  Rt UE     Aggravating Factors   trning to Rt; lying on the Lt side     Pain Relieving Factors  time          [facility] PT Assessment - 05/06/19 0001      Assessment   Medical Diagnosis  Cervical disc disorder; Rt UE radiculopathy     Referring Provider (PT)  Dr Everrett Coombe    Onset Date/Surgical Date  01/31/19    Hand Dominance  Right    Next MD Visit  PRN     Prior Therapy  here for 3 visits       Precautions   Precautions  None      Balance Screen   Has the patient fallen in the past 6 months  No    Has the patient had a decrease in activity level because of a fear of falling?   No    Is the patient reluctant to leave their home because of a fear of falling?   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     Leisure  household chores; sedentary       Observation/Other Assessments   Focus on Therapeutic Outcomes (FOTO)   45% limitation       Sensation   Additional Comments  tingling resolved Rt  UE       Posture/Postural Control   Posture Comments  head forward; shoulders rounded and elevated; head of the humerus anterior in orientation; scapulae abducted and rotated along the thoracic wall       AROM   Cervical Flexion  40    Cervical Extension  27    Cervical - Right Side Bend  21    Cervical - Left Side Bend  24    Cervical - Right Rotation  61    Cervical - Left Rotation  54      Strength   Strength Assessment Site  --   Lt UE - 5/5 throughout    Right Shoulder Flexion  --   5-/5   Right Shoulder Extension  5/5    Right Shoulder ABduction  --   5-/5   Right Shoulder Internal Rotation  5/5    Right Shoulder External Rotation  4+/5    Right Elbow Flexion  --   5-/5   Right Elbow Extension  4+/5    Right Forearm Pronation  --   5-/5   Right Forearm Supination  5/5    Right Wrist Flexion  5/5    Right Wrist Extension  --   5-/5               Objective measurements completed on examination: See above findings.      [facility] Adult PT Treatment/Exercise - 05/06/19 0001      Neuro Re-ed    Neuro Re-ed Details   initiated postural correction/neuromuscular  re-education       Shoulder Exercises: Standing   Other Standing Exercises  axial extension 10 sec x 5; scap squeeze 10 sec x 5; L's x 10; W's x 10 with noodle     Other Standing Exercises  triceps wall push up x 10       Shoulder Exercises: Stretch   Other Shoulder Stretches  3 way doorway stretch 30 sec x 2 reps each position     Other Shoulder Stretches  neural tension stretch 1min x 2 reps       Moist Heat Therapy   Number Minutes Moist Heat  15 Minutes    Moist Heat Location  Cervical   thoracic      Electrical Stimulation   Electrical Stimulation Location  bilat cervical and upper trap     Electrical Stimulation Action  IFC    Electrical Stimulation Parameters  to tolerance    Electrical Stimulation Goals  Pain;Tone      Manual Therapy   Manual therapy comments  pt supine     Soft tissue mobilization  deep tissue work ant/lat/post cervical musculature; upper traps     Manual Traction  gentle traction post treatment 20-30 sec x 3 reps              PT Education - 05/06/19 1001    Education Details  HEP; TENS     Person(s) Educated  Patient    Methods  Explanation;Demonstration;Tactile cues;Verbal cues;Handout    Comprehension  Verbalized understanding;Returned demonstration;Verbal cues required;Tactile cues required          PT Long Term Goals - 05/06/19 1057      PT LONG TERM GOAL #1   Title  independent with HEP 06/17/2019    Time  6    Period  Weeks    Status  New      PT LONG TERM GOAL #2   Title  5-/5 to  5/5 strength Rt UE 06/17/2019    Time  2    Period  Weeks    Status  New      PT LONG TERM GOAL #3   Title  improve Rt cervical rotation to at least 55 degrees without pain for improved function 06/17/2019    Time  6    Period  Weeks    Status  New      PT LONG TERM GOAL #4   Title  FOTO score improved to </= 35% limited for improved function 06/17/2019    Time  6    Period  Weeks    Status  New      PT LONG TERM GOAL #5   Title   demonstrate improved posture and alignment with patient to demonstrated improved upright posture with posterior shoulder girdle engaged 06/17/2019    Time  6    Period  Weeks    Status  New             Plan - 05/06/19 4098    Clinical Impression Statement  Debra Pena presents with continued symptoms related to cervical disc dysfunction. She has abnormal posture and alignment; limited cervical mobility/ROM; Rt UE weakness; muscular tightness through the Rt upper quarter. She will benefit from PT to address problems identified.     Stability/Clinical Decision Making  Stable/Uncomplicated    Clinical Decision Making  Low    Rehab Potential  Good    PT Frequency  2x / week    PT Duration  6 weeks    PT Treatment/Interventions  Patient/family education;ADLs/Self Care Home Management;Cryotherapy;Electrical Stimulation;Iontophoresis /ml Dexamethasone;Moist Heat;Traction;Ultrasound;Manual techniques;Dry needling;Passive range of motion;Therapeutic activities;Therapeutic exercise;Neuromuscular re-education    PT Next Visit Plan  review HEP; progress with prolonged snow angel stretch and posterior shoulder girdle strengthening; trial of manual work and possibly DN; modalities as indicated     PT Home Exercise Plan  Access Code: [email or web address]; HEP; TENS unit     Consulted and Agree with Plan of Care  Patient       Patient will benefit from skilled therapeutic intervention in order to improve the following deficits and impairments:  Postural dysfunction, Improper body mechanics, Pain, Increased fascial restricitons, Increased muscle spasms, Decreased mobility, Decreased range of motion, Decreased activity tolerance  Visit Diagnosis: Radiculopathy, cervical region - Plan: PT plan of care cert/re-cert  Abnormal posture - Plan: PT plan of care cert/re-cert     Problem List Patient Active Problem List   Diagnosis Date Noted  . Cervical radiculopathy 02/02/2019  . Nicotine dependence 07/30/2018  .  Hoarseness 07/30/2018  . Age-related nuclear cataract of both eyes 04/09/2018  . Dermatochalasis of both upper eyelids 04/09/2018  . Primary open angle glaucoma (POAG) of both eyes, mild stage 04/09/2018    Embry Huss Rober Minion PT, MPH  05/06/2019, 11:03 AM  [facility] [address] [place], [place], [address] Phone: [phone]   Fax:  [phone]  Name: Debra Pena MRN: [id number] Date of Birth: 08-Mar-1956

## 2019-05-08 ENCOUNTER — Encounter: Payer: Self-pay | Admitting: Rehabilitative and Restorative Service Providers"

## 2019-05-08 ENCOUNTER — Other Ambulatory Visit: Payer: Self-pay

## 2019-05-08 ENCOUNTER — Ambulatory Visit (INDEPENDENT_AMBULATORY_CARE_PROVIDER_SITE_OTHER): Payer: Federal, State, Local not specified - PPO | Admitting: Rehabilitative and Restorative Service Providers"

## 2019-05-08 DIAGNOSIS — M5412 Radiculopathy, cervical region: Secondary | ICD-10-CM | POA: Diagnosis not present

## 2019-05-08 DIAGNOSIS — R293 Abnormal posture: Secondary | ICD-10-CM | POA: Diagnosis not present

## 2019-05-08 NOTE — Therapy (Signed)
[facility] [place] [address] [place], [place], [address] Phone: [phone]   Fax:  [phone]  Physical Therapy Treatment  Patient Details  Name: Debra Pena MRN: [id number] Date of Birth: [birth year] Referring Provider (PT): Dr Everrett Coombeody Matthews   Encounter Date: 05/08/2019  PT End of Session - 05/08/19 0903    Visit Number  2    Number of Visits  12    Date for PT Re-Evaluation  06/17/19    PT Start Time  0902    PT Stop Time  1001    PT Time Calculation (min)  59 min    Activity Tolerance  Patient tolerated treatment well       Past Medical History:  Diagnosis Date  . Dermatochalasis of both upper eyelids 04/09/2018  . Nicotine dependence 07/30/2018  . Primary open angle glaucoma (POAG) of both eyes, mild stage 04/09/2018    Past Surgical History:  Procedure Laterality Date  . ANTERIOR CERVICAL DISCECTOMY  2001    There were no vitals filed for this visit.  Subjective Assessment - 05/08/19 0904    Subjective  Patient reports that she was sore yesterday but feels good so far today. She has no pain and no radiating symptoms into the Rt UE.     Currently in Pain?  No/denies                       [facility]/Exercise - 05/08/19 0001      Elbow Exercises   Elbow Flexion  Strengthening;Right;10 reps;Supine;Bar weights/barbell   2#     Shoulder Exercises: Standing   Extension  Strengthening;Both;10 reps;Theraband    Theraband Level (Shoulder Extension)  Level 2 (Red)    Row  Strengthening;Both;10 reps;Theraband    Theraband Level (Shoulder Row)  Level 2 (Red)    Retraction  Strengthening;Both;10 reps;Theraband    Theraband Level (Shoulder Retraction)  Level 2 (Red)    Other Standing Exercises  axial extension 10 sec x 5; scap squeeze 10 sec x 5; L's x 10; W's x 10 with noodle     Other Standing Exercises  triceps wall push up x 10       Shoulder Exercises: Stretch   Other Shoulder  Stretches  3 way doorway stretch 30 sec x 2 reps each position     Other Shoulder Stretches  neural tension stretch 1min x 2 reps       Moist Heat Therapy   Number Minutes Moist Heat  15 Minutes    Moist Heat Location  Cervical   thoracic      Electrical Stimulation   Electrical Stimulation Location  bilat cervical and upper trap     Electrical Stimulation Action  IFC    Electrical Stimulation Parameters  to tolerance    Electrical Stimulation Goals  Pain;Tone      Manual Therapy   Manual therapy comments  pt supine     Soft tissue mobilization  deep tissue work ant/lat/post cervical musculature; upper traps     Myofascial Release  anterior chest     Passive ROM  into cervical flexion; cervical flexion with slight rotation; lateral cervical flexion     Manual Traction  gentle traction post treatment 20-30 sec x 3 reps                   PT Long Term Goals - 05/06/19 1057      PT LONG TERM GOAL #1  Title  independent with HEP 06/17/2019    Time  6    Period  Weeks    Status  New      PT LONG TERM GOAL #2   Title  5-/5 to 5/5 strength Rt UE 06/17/2019    Time  2    Period  Weeks    Status  New      PT LONG TERM GOAL #3   Title  improve Rt cervical rotation to at least 55 degrees without pain for improved function 06/17/2019    Time  6    Period  Weeks    Status  New      PT LONG TERM GOAL #4   Title  FOTO score improved to </= 35% limited for improved function 06/17/2019    Time  6    Period  Weeks    Status  New      PT LONG TERM GOAL #5   Title  demonstrate improved posture and alignment with patient to demonstrated improved upright posture with posterior shoulder girdle engaged 06/17/2019    Time  6    Period  Weeks    Status  New            Plan - 05/08/19 8119    Clinical Impression Statement  Tolerated exercises well - required some reminders for hold time for stretched. Note muscular tightness through Rt upper quarter. Responded well to manual  work. Added exercises without difficulty     Stability/Clinical Decision Making  Stable/Uncomplicated    Rehab Potential  Good    PT Frequency  2x / week    PT Duration  6 weeks    PT Treatment/Interventions  Patient/family education;ADLs/Self Care Home Management;Cryotherapy;Electrical Stimulation;Iontophoresis 4mg /ml Dexamethasone;Moist Heat;Traction;Ultrasound;Manual techniques;Dry needling;Passive range of motion;Therapeutic activities;Therapeutic exercise;Neuromuscular re-education    PT Next Visit Plan  review HEP; progress with prolonged snow angel stretch and posterior shoulder girdle strengthening; trial of manual work and possibly DN; modalities as indicated     PT Home Exercise Plan  Access Code: [email or web address]; HEP; TENS unit; Access Code: [email or web address]     Consulted and Agree with Plan of Care  Patient       Patient will benefit from skilled therapeutic intervention in order to improve the following deficits and impairments:  Postural dysfunction, Improper body mechanics, Pain, Increased fascial restricitons, Increased muscle spasms, Decreased mobility, Decreased range of motion, Decreased activity tolerance  Visit Diagnosis: Radiculopathy, cervical region  Abnormal posture     Problem List Patient Active Problem List   Diagnosis Date Noted  . Cervical radiculopathy 02/02/2019  . Nicotine dependence 07/30/2018  . Hoarseness 07/30/2018  . Age-related nuclear cataract of both eyes 04/09/2018  . Dermatochalasis of both upper eyelids 04/09/2018  . Primary open angle glaucoma (POAG) of both eyes, mild stage 04/09/2018     Rober Minion PT, MPH  05/08/2019, 9:44 AM  [facility] [address] [place], [place], [address] Phone: [phone]   Fax:  [phone]  Name: Debra Pena MRN: [id number] Date of Birth: February 20, 1956

## 2019-05-11 ENCOUNTER — Other Ambulatory Visit: Payer: Self-pay

## 2019-05-11 ENCOUNTER — Ambulatory Visit (INDEPENDENT_AMBULATORY_CARE_PROVIDER_SITE_OTHER): Payer: Federal, State, Local not specified - PPO | Admitting: Rehabilitative and Restorative Service Providers"

## 2019-05-11 ENCOUNTER — Encounter: Payer: Self-pay | Admitting: Rehabilitative and Restorative Service Providers"

## 2019-05-11 DIAGNOSIS — R293 Abnormal posture: Secondary | ICD-10-CM

## 2019-05-11 DIAGNOSIS — M5412 Radiculopathy, cervical region: Secondary | ICD-10-CM

## 2019-05-11 NOTE — Patient Instructions (Signed)

## 2019-05-11 NOTE — Therapy (Signed)
[facility] [place] [address] [place], [place], [address] Phone: [phone]   Fax:  [phone]  Physical Therapy Treatment  Patient Details  Name: Debra Pena MRN: [id number] Date of Birth: 30-Jul-1956 Referring Provider (PT): Dr Everrett Coombe   Encounter Date: 05/11/2019  PT End of Session - 05/11/19 0903    Visit Number  3    Number of Visits  12    Date for PT Re-Evaluation  06/17/19    PT Start Time  0901    PT Stop Time  1000    PT Time Calculation (min)  59 min    Activity Tolerance  Patient tolerated treatment well       Past Medical History:  Diagnosis Date  . Dermatochalasis of both upper eyelids 04/09/2018  . Nicotine dependence 07/30/2018  . Primary open angle glaucoma (POAG) of both eyes, mild stage 04/09/2018    Past Surgical History:  Procedure Laterality Date  . ANTERIOR CERVICAL DISCECTOMY  2001    There were no vitals filed for this visit.  Subjective Assessment - 05/11/19 0903    Subjective  Feels treatment is helping. Working on her exercises at home. Feels the tightness in the neck area but not having pain. Strength remains decreased.     Currently in Pain?  No/denies                       [facility] Adult PT Treatment/Exercise - 05/11/19 0001      Shoulder Exercises: Standing   Other Standing Exercises  triceps wall push up x 10 - small ball in Rt hand to increase work Rt       Shoulder Exercises: Therapy Information systems manager Exercises  small ball on wall flex/ext; horizontal ab/add; circles CW/CCW x 10 each direction       Shoulder Exercises: Stretch   Other Shoulder Stretches  3 way doorway stretch 30 sec x 2 reps each position     Other Shoulder Stretches  neural tension stretch x 2 reps       Moist Heat Therapy   Number Minutes Moist Heat  15 Minutes    Moist Heat Location  Cervical   thoracic      Electrical Stimulation   Electrical Stimulation Location   bilat cervical and upper trap     Electrical Stimulation Action  IFC    Electrical Stimulation Parameters  to tolerance     Electrical Stimulation Goals  Pain;Tone      Manual Therapy   Manual therapy comments  pt supine     Soft tissue mobilization  deep tissue work ant/lat/post cervical musculature; upper traps     Myofascial Release  anterior chest     Passive ROM  into cervical flexion; cervical flexion with slight rotation; lateral cervical flexion     Manual Traction  gentle traction post treatment 20-30 sec x 3 reps        Trigger Point Dry Needling - 05/11/19 0001    Consent Given?  Yes    Education Handout Provided  Yes    Dry Needling Comments  Rt     Upper Trapezius Response  Palpable increased muscle length;Twitch reponse elicited    Scalenes Response  Palpable increased muscle length;Twitch reponse elicited           PT Education - 05/11/19 0940    Education Details  DN    Person(s) Educated  Patient  Methods  Explanation;Demonstration;Tactile cues;Verbal cues;Handout    Comprehension  Verbalized understanding;Returned demonstration;Verbal cues required;Tactile cues required          PT Long Term Goals - 05/06/19 1057      PT LONG TERM GOAL #1   Title  independent with HEP 06/17/2019    Time  6    Period  Weeks    Status  New      PT LONG TERM GOAL #2   Title  5-/5 to 5/5 strength Rt UE 06/17/2019    Time  2    Period  Weeks    Status  New      PT LONG TERM GOAL #3   Title  improve Rt cervical rotation to at least 55 degrees without pain for improved function 06/17/2019    Time  6    Period  Weeks    Status  New      PT LONG TERM GOAL #4   Title  FOTO score improved to </= 35% limited for improved function 06/17/2019    Time  6    Period  Weeks    Status  New      PT LONG TERM GOAL #5   Title  demonstrate improved posture and alignment with patient to demonstrated improved upright posture with posterior shoulder girdle engaged 06/17/2019     Time  6    Period  Weeks    Status  New            Plan - 05/11/19 0903    Clinical Impression Statement  Continued improvement with postural correction and HEP. She has responded well to manual work and tolerated DN well. She will benefit from continued with treatment.     Stability/Clinical Decision Making  Stable/Uncomplicated    Rehab Potential  Good    PT Frequency  2x / week    PT Duration  6 weeks    PT Treatment/Interventions  Patient/family education;ADLs/Self Care Home Management;Cryotherapy;Electrical Stimulation;Iontophoresis 4mg /ml Dexamethasone;Moist Heat;Traction;Ultrasound;Manual techniques;Dry needling;Passive range of motion;Therapeutic activities;Therapeutic exercise;Neuromuscular re-education    PT Next Visit Plan  review HEP; progress with prolonged snow angel stretch and posterior shoulder girdle strengthening; trial of manual work and- assess response to DN; modalities as indicated     PT Home Exercise Plan  Access Code: [email or web address]; HEP; TENS unit; Access Code: [email or web address]     Consulted and Agree with Plan of Care  Patient       Patient will benefit from skilled therapeutic intervention in order to improve the following deficits and impairments:  Postural dysfunction, Improper body mechanics, Pain, Increased fascial restricitons, Increased muscle spasms, Decreased mobility, Decreased range of motion, Decreased activity tolerance  Visit Diagnosis: Radiculopathy, cervical region  Abnormal posture     Problem List Patient Active Problem List   Diagnosis Date Noted  . Cervical radiculopathy 02/02/2019  . Nicotine dependence 07/30/2018  . Hoarseness 07/30/2018  . Age-related nuclear cataract of both eyes 04/09/2018  . Dermatochalasis of both upper eyelids 04/09/2018  . Primary open angle glaucoma (POAG) of both eyes, mild stage 04/09/2018    Florentina Marquart Rober MinionP Sumayah Bearse PT, MPH  05/11/2019, 9:44 AM  [facility] [address] [place], [place], [address] Phone: [phone]   Fax:  [phone]  Name: Debra Pena MRN: [id number] Date of Birth: 1956/03/28

## 2019-05-13 ENCOUNTER — Other Ambulatory Visit: Payer: Self-pay

## 2019-05-13 ENCOUNTER — Ambulatory Visit (INDEPENDENT_AMBULATORY_CARE_PROVIDER_SITE_OTHER): Payer: Federal, State, Local not specified - PPO | Admitting: Rehabilitative and Restorative Service Providers"

## 2019-05-13 ENCOUNTER — Encounter: Payer: Self-pay | Admitting: Rehabilitative and Restorative Service Providers"

## 2019-05-13 DIAGNOSIS — M5412 Radiculopathy, cervical region: Secondary | ICD-10-CM | POA: Diagnosis not present

## 2019-05-13 DIAGNOSIS — R293 Abnormal posture: Secondary | ICD-10-CM | POA: Diagnosis not present

## 2019-05-13 NOTE — Therapy (Signed)
[facility] [place] [address] [place], [place], [address] Phone: [phone]   Fax:  [phone]  Physical Therapy Treatment  Patient Details  Name: Debra Pena MRN: [id number] Date of Birth: 02/20/1956 Referring Provider (PT): Dr Everrett Coombe   Encounter Date: 05/13/2019  PT End of Session - 05/13/19 0909    Visit Number  4    Number of Visits  12    Date for PT Re-Evaluation  06/17/19    PT Start Time  0903    PT Stop Time  1000    PT Time Calculation (min)  57 min    Activity Tolerance  Patient tolerated treatment well       Past Medical History:  Diagnosis Date  . Dermatochalasis of both upper eyelids 04/09/2018  . Nicotine dependence 07/30/2018  . Primary open angle glaucoma (POAG) of both eyes, mild stage 04/09/2018    Past Surgical History:  Procedure Laterality Date  . ANTERIOR CERVICAL DISCECTOMY  2001    There were no vitals filed for this visit.  Subjective Assessment - 05/13/19 0909    Subjective  Patient reports that she did well with the DN - feels she can move her head better when turning side to side. Thinks her strength in the Rt UE is a bit better.     Currently in Pain?  No/denies         [facility] PT Assessment - 05/13/19 0001      Assessment   Medical Diagnosis  Cervical disc disorder; Rt UE radiculopathy     Referring Provider (PT)  Dr Everrett Coombe    Onset Date/Surgical Date  01/31/19    Hand Dominance  Right    Next MD Visit  PRN     Prior Therapy  here for 3 visits       Precautions   Precautions  None      Posture/Postural Control   Posture Comments  head forward; shoulders rounded and elevated; head of the humerus anterior in orientation; scapulae abducted and rotated along the thoracic wall       AROM   Cervical Flexion  54    Cervical Extension  30    Cervical - Right Side Bend  29    Cervical - Left Side Bend  31    Cervical - Right Rotation  61    Cervical - Left  Rotation  60      Strength   Right Shoulder External Rotation  --   5-/5   Right Elbow Extension  --   5-/5     Palpation   Palpation comment  muscular tightness Rt ant/lat/posterior cervical musculature; upper trap; leveator; teres; pecs                    [facility] Adult PT Treatment/Exercise - 05/13/19 0001      Neuro Re-ed    Neuro Re-ed Details   continued neuromuscular re-education with verbal and tactile cues for engaging posterior shoulder girdle musculature       Exercises   Exercises  --   prone axial extension w/ PT assist post DN x 5 reps     Shoulder Exercises: Seated   Other Seated Exercises  scapular depression with yoga blocks x 10 VC for technique       Shoulder Exercises: Standing   Other Standing Exercises  triceps wall push up x 10 - small ball in Rt hand to increase work Rt  Shoulder Exercises: Therapy Ball   Other Therapy Ball Exercises  pressing into large red ball - scapular depression VC for technique - 5 sec hold x 10 reps       Shoulder Exercises: Stretch   Other Shoulder Stretches  3 way doorway stretch 30 sec x 2 reps each position     Other Shoulder Stretches  neural tension stretch x 2 reps       Moist Heat Therapy   Number Minutes Moist Heat  15 Minutes    Moist Heat Location  Cervical   thoracic      Electrical Stimulation   Electrical Stimulation Location  posterior Rt cervical/thoracic to upper trap     Electrical Stimulation Action  IFC    Electrical Stimulation Parameters  to tolerance    Electrical Stimulation Goals  Pain;Tone      Manual Therapy   Manual therapy comments  pt prone     Soft tissue mobilization  deep tissue work posterior cervical and thoracic spine musculature; upper trap; leveator; periscapular areas - Rt     Myofascial Release  posterior cervical to thoracic spine Rt        Trigger Point Dry Needling - 05/13/19 0001    Consent Given?  Yes    Dry Needling Comments  Rt    Upper Trapezius  Response  Palpable increased muscle length;Twitch reponse elicited    SubOccipitals Response  Palpable increased muscle length    Levator Scapulae Response  Palpable increased muscle length;Twitch response elicited    Supraspinatus Response  Palpable increased muscle length    Longissimus Response  Palpable increased muscle length;Twitch response elicited   cervical to upper thoracic                PT Long Term Goals - 05/06/19 1057      PT LONG TERM GOAL #1   Title  independent with HEP 06/17/2019    Time  6    Period  Weeks    Status  New      PT LONG TERM GOAL #2   Title  5-/5 to 5/5 strength Rt UE 06/17/2019    Time  2    Period  Weeks    Status  New      PT LONG TERM GOAL #3   Title  improve Rt cervical rotation to at least 55 degrees without pain for improved function 06/17/2019    Time  6    Period  Weeks    Status  New      PT LONG TERM GOAL #4   Title  FOTO score improved to </= 35% limited for improved function 06/17/2019    Time  6    Period  Weeks    Status  New      PT LONG TERM GOAL #5   Title  demonstrate improved posture and alignment with patient to demonstrated improved upright posture with posterior shoulder girdle engaged 06/17/2019    Time  6    Period  Weeks    Status  New            Plan - 05/13/19 0909    Clinical Impression Statement  Excellent progress with decreased pain; increased strength and ROM; decreased palpable tightness. Progressing well with rehab. Will benefit from continued treatment to reach maximum rehab potential.     Stability/Clinical Decision Making  Stable/Uncomplicated    Rehab Potential  Good    PT Frequency  2x / week  PT Duration  6 weeks    PT Treatment/Interventions  Patient/family education;ADLs/Self Care Home Management;Cryotherapy;Electrical Stimulation;Iontophoresis 4mg /ml Dexamethasone;Moist Heat;Traction;Ultrasound;Manual techniques;Dry needling;Passive range of motion;Therapeutic  activities;Therapeutic exercise;Neuromuscular re-education    PT Next Visit Plan  review HEP; progress with posterior shoulder girdle strengthening; continue manual work and DN; modalities as indicated     PT Home Exercise Plan  Access Code: [email or web address]; HEP; TENS unit; Access Code: [email or web address]     Consulted and Agree with Plan of Care  Patient       Patient will benefit from skilled therapeutic intervention in order to improve the following deficits and impairments:  Postural dysfunction, Improper body mechanics, Pain, Increased fascial restricitons, Increased muscle spasms, Decreased mobility, Decreased range of motion, Decreased activity tolerance  Visit Diagnosis: Radiculopathy, cervical region  Abnormal posture     Problem List Patient Active Problem List   Diagnosis Date Noted  . Cervical radiculopathy 02/02/2019  . Nicotine dependence 07/30/2018  . Hoarseness 07/30/2018  . Age-related nuclear cataract of both eyes 04/09/2018  . Dermatochalasis of both upper eyelids 04/09/2018  . Primary open angle glaucoma (POAG) of both eyes, mild stage 04/09/2018    Torri Michalski Rober MinionP Jase Himmelberger PT, MPH  05/13/2019, 10:12 AM  [facility] [address] [place], [place], [address] Phone: [phone]   Fax:  [phone]  Name: Carroll SageCatherine Jaycox MRN: [id number] Date of Birth: [birth year]

## 2019-05-27 ENCOUNTER — Other Ambulatory Visit: Payer: Self-pay

## 2019-05-27 ENCOUNTER — Ambulatory Visit (INDEPENDENT_AMBULATORY_CARE_PROVIDER_SITE_OTHER): Payer: Federal, State, Local not specified - PPO | Admitting: Rehabilitative and Restorative Service Providers"

## 2019-05-27 ENCOUNTER — Encounter: Payer: Self-pay | Admitting: Rehabilitative and Restorative Service Providers"

## 2019-05-27 DIAGNOSIS — M5412 Radiculopathy, cervical region: Secondary | ICD-10-CM

## 2019-05-27 DIAGNOSIS — R293 Abnormal posture: Secondary | ICD-10-CM

## 2019-05-27 NOTE — Patient Instructions (Signed)
Access Code: [email or web address]  URL: [email or web address]  Date: 05/27/2019  Prepared by: Corlis Leak   Exercises  Shoulder External Rotation and Scapular Retraction with Resistance - 10 reps - 3 sets - 2x daily - 7x weekly  Scapular Retraction with Resistance - 10 reps - 3 sets - 2x daily - 7x weekly  Scapular Retraction with Resistance Advanced - 10 reps - 3 sets - 2x daily - 7x weekly  Supine Elbow Extension with Dumbbell - 10 reps - 1 sets - 2-3sec hold - 2x daily - 7x weekly  Supine Elbow Extension with Dumbbell - 10 reps - 2-3 sets - 3-5sec hold - 1x daily - 7x weekly  Seated Triceps Extension Single Arm - 10 reps - 2-3 sets - 5-7 sec hold - 1x daily - 7x weekly

## 2019-05-27 NOTE — Therapy (Signed)
[facility] [place], [place], [address] Phone: [phone]   Fax:  [phone]  Physical Therapy Treatment  Patient Details  Name: Debra Pena MRN: [id number] Date of Birth: 04-Apr-1956 Referring Provider (PT): Dr Luetta Nutting   Encounter Date: 05/27/2019  PT End of Session - 05/27/19 0903    Visit Number  5    Number of Visits  12    Date for PT Re-Evaluation  06/17/19    PT Start Time  0903    PT Stop Time  1000    PT Time Calculation (min)  57 min    Activity Tolerance  Patient tolerated treatment well       Past Medical History:  Diagnosis Date  . Dermatochalasis of both upper eyelids 04/09/2018  . Nicotine dependence 07/30/2018  . Primary open angle glaucoma (POAG) of both eyes, mild stage 04/09/2018    Past Surgical History:  Procedure Laterality Date  . ANTERIOR CERVICAL DISCECTOMY  2001    There were no vitals filed for this visit.  Subjective Assessment - 05/27/19 0904    Subjective  Patient reports that she is doing well. She feels she is ready for discharge. Arm feels strong. No pain or problems. She has treturned to all normal functional activities.     Currently in Pain?  No/denies         [facility] PT Assessment - 05/27/19 0001      Assessment   Medical Diagnosis  Cervical disc disorder; Rt UE radiculopathy     Referring Provider (PT)  Dr Luetta Nutting    Onset Date/Surgical Date  01/31/19    Hand Dominance  Right    Next MD Visit  PRN     Prior Therapy  here for 3 visits       Observation/Other Assessments   Focus on Therapeutic Outcomes (FOTO)   40% limitation       Posture/Postural Control   Posture Comments  head forward; shoulders rounded and elevated; head of the humerus anterior in orientation; scapulae abducted and rotated along the thoracic wall       AROM   Cervical Flexion  40    Cervical Extension  33    Cervical - Right Side Bend  27    Cervical - Left Side  Bend  30    Cervical - Right Rotation  36    Cervical - Left Rotation  50      Strength   Right Shoulder External Rotation  5/5    Right Elbow Extension  5/5      Palpation   Palpation comment  improving muscular tightness Rt ant/lat/posterior cervical musculature; upper trap; leveator; teres; pecs                    [facility] Adult PT Treatment/Exercise - 05/27/19 0001      Neuro Re-ed    Neuro Re-ed Details   neuromuscular re-education with verbal and tactile cues for engaging posterior shoulder girdle musculature       Shoulder Exercises: Supine   Other Supine Exercises  triceps extension with slow eccentric lowering 3# wt x 10 reps       Shoulder Exercises: Standing   Other Standing Exercises  triceps wall push up x 10 - small ball in Rt hand to increase work Rt       Shoulder Exercises: Therapy Copywriter, advertising Exercises  pressing into large red ball - scapular  depression VC for technique - 5 sec hold x 10 reps       Shoulder Exercises: Stretch   Other Shoulder Stretches  3 way doorway stretch 30 sec x 2 reps each position     Other Shoulder Stretches  neural tension stretch 102mn x 2 reps       Moist Heat Therapy   Number Minutes Moist Heat  15 Minutes    Moist Heat Location  Cervical   thoracic      Electrical Stimulation   Electrical Stimulation Location  posterior Rt cervical/thoracic to upper trap     Electrical Stimulation Action  IFC    Electrical Stimulation Parameters  to tolerance    Electrical Stimulation Goals  Tone      Manual Therapy   Manual therapy comments  pt supine     Soft tissue mobilization  deep tissue work posterior cervical and thoracic spine musculature; upper trap; leveator; periscapular areas - Rt     Myofascial Release  anterior chest              PT Education - 05/27/19 0935    Education Details  HEP     Person(s) Educated  Patient    Methods  Explanation;Demonstration;Tactile cues;Verbal cues;Handout     Comprehension  Verbalized understanding;Returned demonstration;Verbal cues required;Tactile cues required          PT Long Term Goals - 05/27/19 0927      PT LONG TERM GOAL #1   Title  independent with HEP 06/17/2019    Time  6    Period  Weeks    Status  Achieved      PT LONG TERM GOAL #2   Title  5-/5 to 5/5 strength Rt UE 06/17/2019    Time  2    Period  Weeks    Status  Achieved      PT LONG TERM GOAL #3   Title  improve Rt cervical rotation to at least 55 degrees without pain for improved function 06/17/2019    Time  6    Period  Weeks    Status  Partially Met      PT LONG TERM GOAL #4   Title  FOTO score improved to </= 35% limited for improved function 06/17/2019    Time  6    Period  Weeks    Status  Partially Met      PT LONG TERM GOAL #5   Title  demonstrate improved posture and alignment with patient to demonstrated improved upright posture with posterior shoulder girdle engaged 06/17/2019    Time  6    Period  Weeks    Status  Achieved            Plan - 05/27/19 01937   Clinical Impression Statement  Excellent progress. PAtient pleased with progress and current level of function. She feels ready for d/c to independent HEP. Goals of therapy accomplished except FOTO goal of 39% limitation. Pt at 40% limitatiion. Added strengthening exercise for Rt triceps and reviewed all exercises. Will d/c to HEP. Patient will call with any questions or problems.     Stability/Clinical Decision Making  Stable/Uncomplicated    Rehab Potential  Good    PT Frequency  2x / week    PT Duration  6 weeks    PT Treatment/Interventions  Patient/family education;ADLs/Self Care Home Management;Cryotherapy;Electrical Stimulation;Iontophoresis 417mml Dexamethasone;Moist Heat;Traction;Ultrasound;Manual techniques;Dry needling;Passive range of motion;Therapeutic activities;Therapeutic exercise;Neuromuscular re-education    PT Next Visit  Plan  D/C to independent HEP     PT Home Exercise  Plan  Access Code: [email or web address]; HEP; TENS unit; Access Code: [email or web address]     Consulted and Agree with Plan of Care  Patient       Patient will benefit from skilled therapeutic intervention in order to improve the following deficits and impairments:  Postural dysfunction, Improper body mechanics, Pain, Increased fascial restricitons, Increased muscle spasms, Decreased mobility, Decreased range of motion, Decreased activity tolerance  Visit Diagnosis: Radiculopathy, cervical region  Abnormal posture     Problem List Patient Active Problem List   Diagnosis Date Noted  . Cervical radiculopathy 02/02/2019  . Nicotine dependence 07/30/2018  . Hoarseness 07/30/2018  . Age-related nuclear cataract of both eyes 04/09/2018  . Dermatochalasis of both upper eyelids 04/09/2018  . Primary open angle glaucoma (POAG) of both eyes, mild stage 04/09/2018    Celyn P Holt Pt, MPH  05/27/2019, 9:51 AM  [facility] [place], [place], [address] Phone: [phone]   Fax:  [phone]  Name: Debra Pena MRN: [id number] Date of Birth: 1956/02/14  PHYSICAL THERAPY DISCHARGE SUMMARY  Visits from Start of Care: 5  Current functional level related to goals / functional outcomes: See progress note for discharge status - excellent progress    Remaining deficits: Continued muscular tightness through the cervical and shoulder girdle areas. Encouraged patient to continue exercises program and consider having a deep tissue massage on a regular basis as a preventative measure for further problems.   Education / Equipment: HEP  Plan: Patient agrees to discharge.  Patient goals were partially met. Patient is being discharged due to being pleased with the current functional level.  ?????   Celyn P. Helene Kelp PT, MPH 05/27/19 9:54 AM

## 2019-06-03 DIAGNOSIS — K080 Exfoliation of teeth due to systemic causes: Secondary | ICD-10-CM | POA: Diagnosis not present

## 2019-07-02 DIAGNOSIS — H2513 Age-related nuclear cataract, bilateral: Secondary | ICD-10-CM | POA: Diagnosis not present

## 2019-07-02 DIAGNOSIS — H401131 Primary open-angle glaucoma, bilateral, mild stage: Secondary | ICD-10-CM | POA: Diagnosis not present

## 2019-07-22 IMAGING — MR MR CERVICAL SPINE W/O CM
5 series · 33 of 48 positions shown · non-contrast
Comparison: None.

CLINICAL DATA: Neck pain with positional numbness and tingling in
the right arm and fingers for 1 month

EXAM:
MRI CERVICAL SPINE WITHOUT CONTRAST
TECHNIQUE: Multiplanar, multisequence MR imaging of the cervical spine was
performed. No intravenous contrast was administered.

[Series 3: T2 · sagittal · 3.0mm · 0.56mm/px · 6 of 13 slices shown (1 of 2)]
[im 1/13]
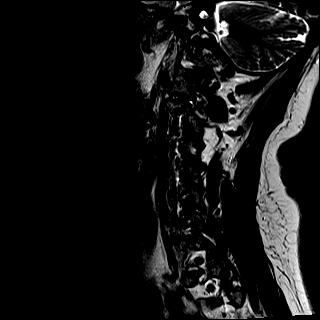
[im 3/13]
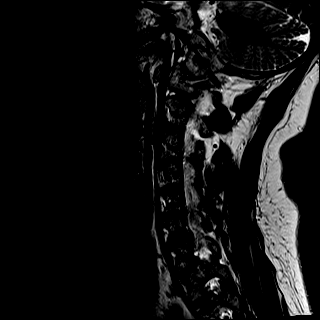
[im 5/13]
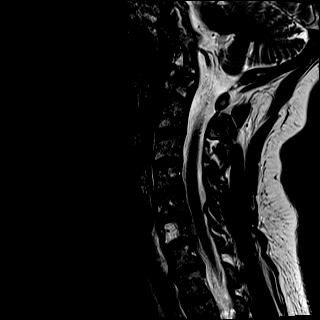
[im 8/13]
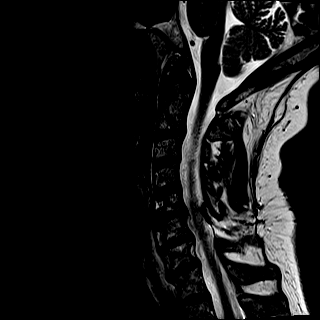
[im 10/13]
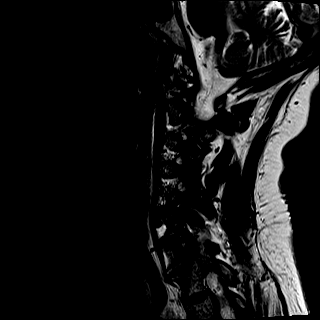
[im 13/13]
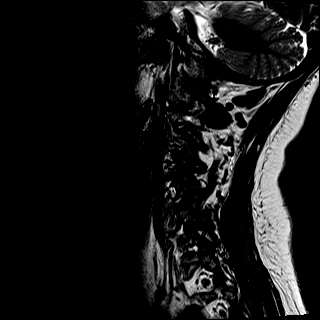

[Series 4: T1 · sagittal · 3.0mm · 0.70mm/px · 7 of 13 slices shown]
[im 1/13]
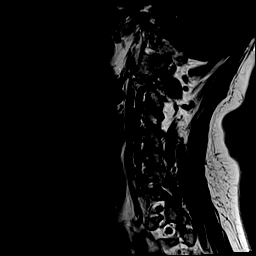
[im 3/13]
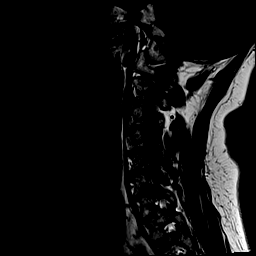
[im 5/13]
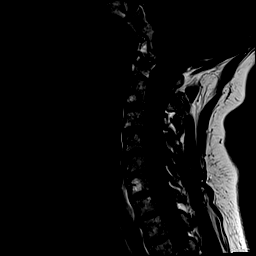
[im 7/13]
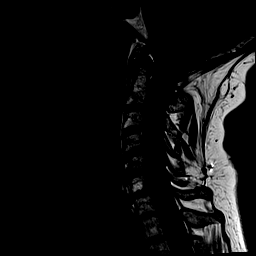
[im 9/13]
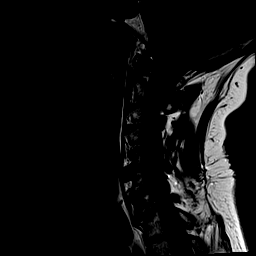
[im 11/13]
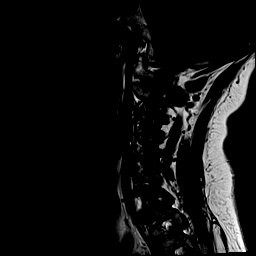
[im 13/13]
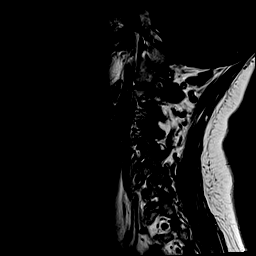

[Series 5: STIR · sagittal · 3.0mm · 0.35mm/px · 7 of 13 slices shown]
[im 1/13]
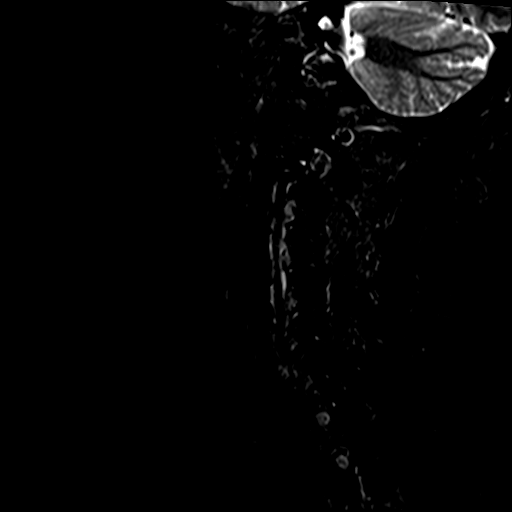
[im 3/13]
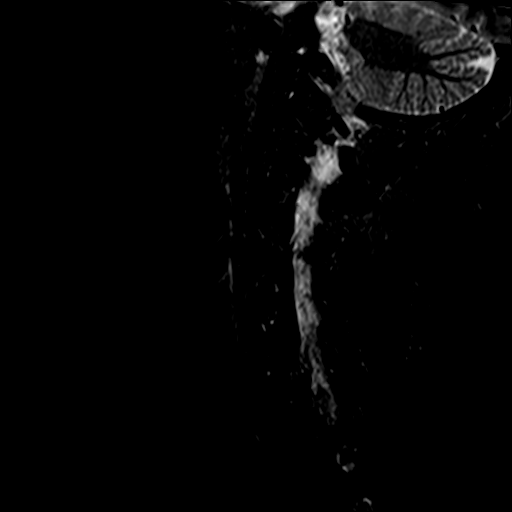
[im 5/13]
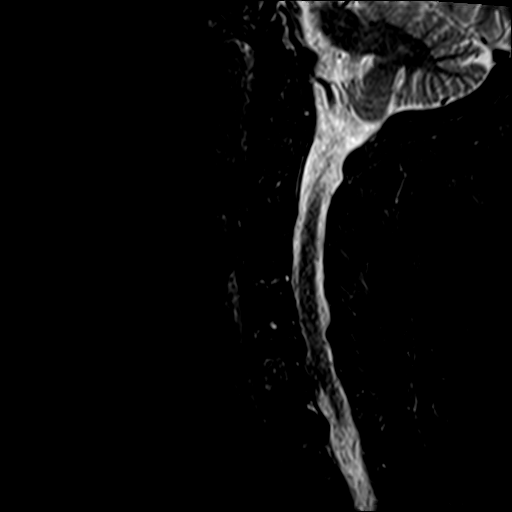
[im 7/13]
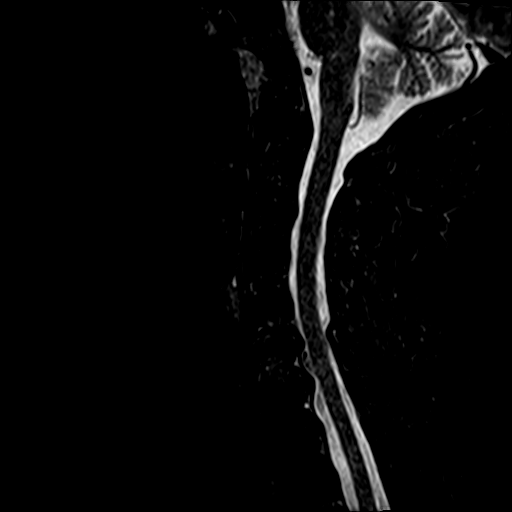
[im 9/13]
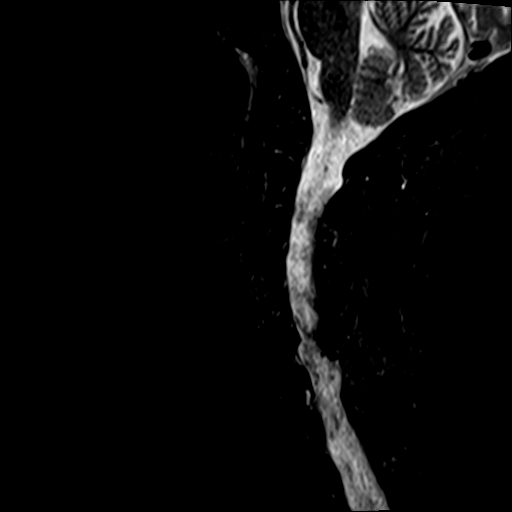
[im 11/13]
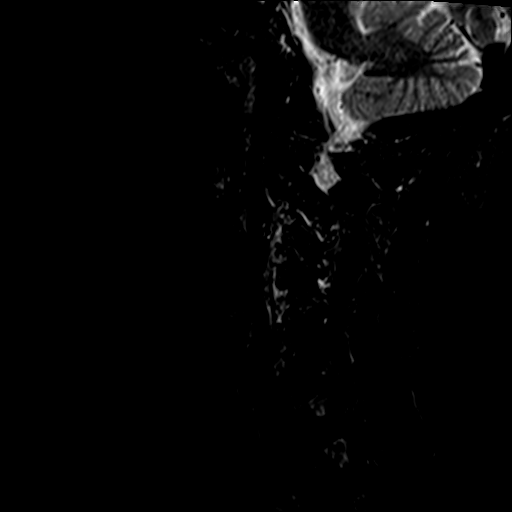
[im 13/13]
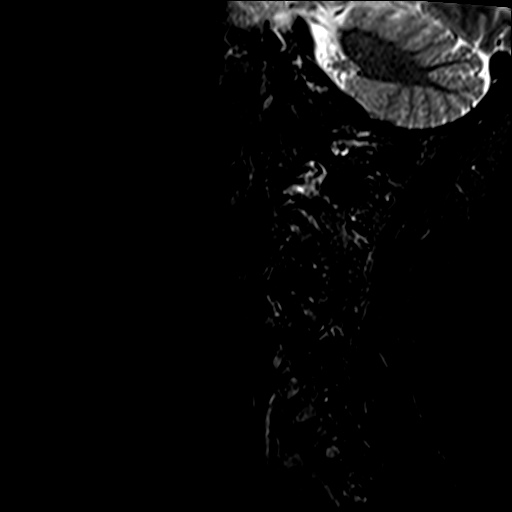

[Series 6: T2 · axial · 3.0mm · 0.62mm/px · z∈[-54,+35]mm · 8 of 26 slices shown (2 of 2)]
[im 1/26]
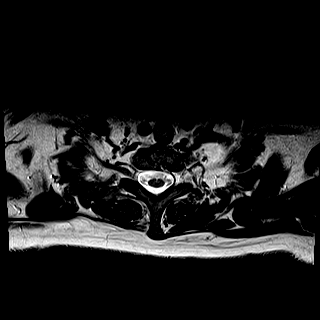
[im 4/26]
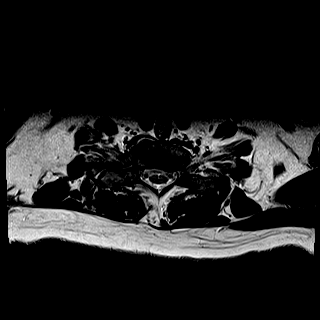
[im 8/26]
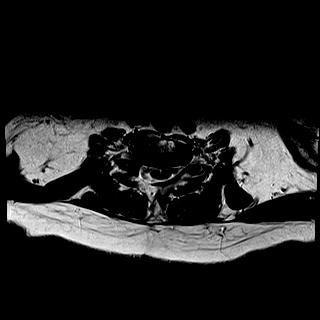
[im 12/26]
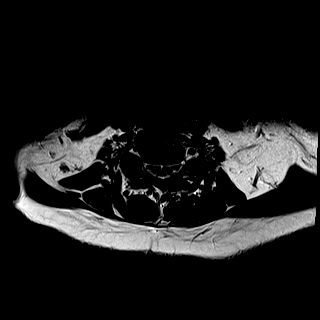
[im 14/26]
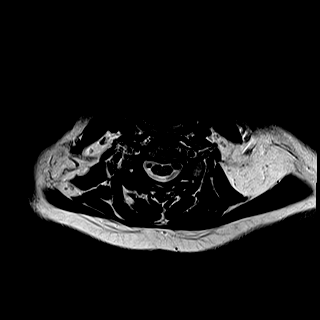
[im 18/26]
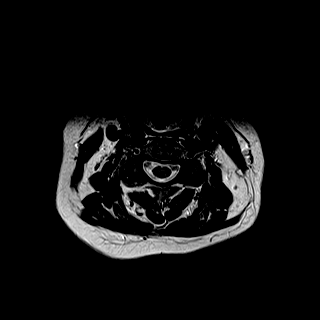
[im 22/26]
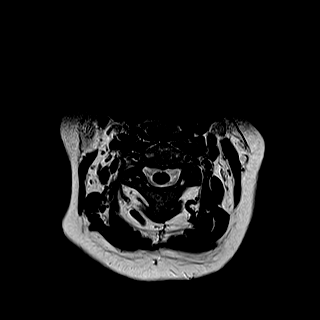
[im 26/26]
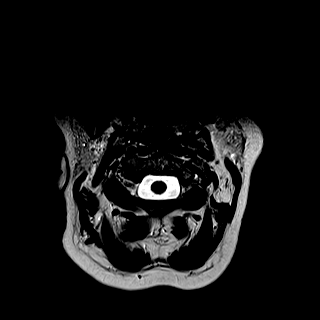

[Series 7: mpgr ax · axial · 3.0mm · 0.35mm/px · z∈[-42,+4]mm · 5 of 26 slices shown]
[im 1/26]
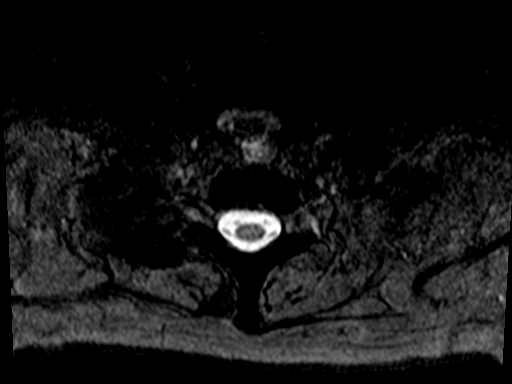
[im 4/26]
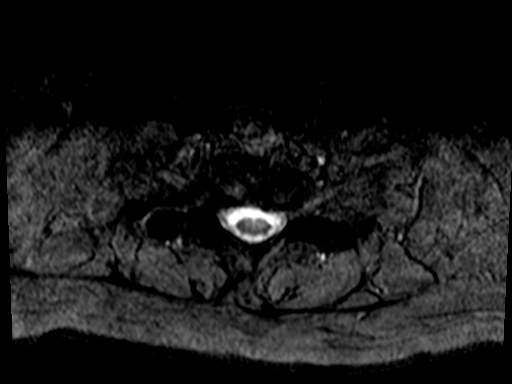
[im 8/26]
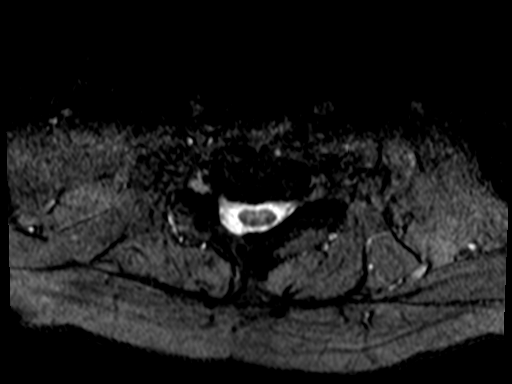
[im 12/26]
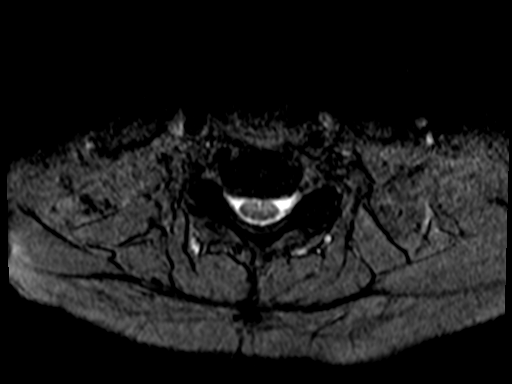
[im 14/26]
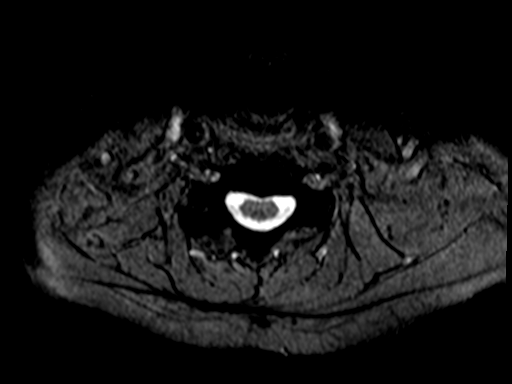

[33 of 48 positions shown; findings below may reference images not displayed]

FINDINGS: Alignment: Borderline anterolisthesis at C4-5.

Vertebrae: Hemangioma in the C6 body. There has also been a
right-sided hemilaminectomy at this level. No fracture, discitis, or
aggressive bone lesion.

Cord: Normal signal and morphology

Posterior Fossa, vertebral arteries, paraspinal tissues: Expected
soft tissue scarring at C6.

Disc levels:

C2-3: Asymmetric right moderate facet spurring. Mild right foraminal
narrowing

C3-4: Degenerative facet spurring asymmetric to the right. Mild
bilateral foraminal narrowing.

C4-5: Asymmetric right facet spurring.  No impingement

C5-6: Disc narrowing with uncovertebral spurring asymmetric to the
right where there is high-grade foraminal stenosis. Patent canal.

C6-7: Disc narrowing and bulging with uncovertebral ridging on the
right more than left. Moderate bilateral foraminal stenosis. Patent
spinal canal.

C7-T1:Isointense right foraminal herniation with moderate narrowing.
Patent canal.
IMPRESSION: 1. On the symptomatic right side there is advanced foraminal
impingement at C5-6 and moderate foraminal narrowing at C6-7 and
C7-T1. The right C7-T1 foraminal stenosis is related to disc
herniation.
2. On the left there is moderate foraminal narrowing at C6-7.
3. Diffusely patent spinal canal. Remote right hemilaminectomy at
C6.

## 2019-10-07 DIAGNOSIS — K080 Exfoliation of teeth due to systemic causes: Secondary | ICD-10-CM | POA: Diagnosis not present

## 2020-03-16 DIAGNOSIS — K080 Exfoliation of teeth due to systemic causes: Secondary | ICD-10-CM | POA: Diagnosis not present

## 2020-03-28 DIAGNOSIS — H40023 Open angle with borderline findings, high risk, bilateral: Secondary | ICD-10-CM | POA: Diagnosis not present

## 2020-03-28 DIAGNOSIS — H2513 Age-related nuclear cataract, bilateral: Secondary | ICD-10-CM | POA: Diagnosis not present

## 2020-04-14 DIAGNOSIS — K080 Exfoliation of teeth due to systemic causes: Secondary | ICD-10-CM | POA: Diagnosis not present

## 2020-05-25 DIAGNOSIS — K080 Exfoliation of teeth due to systemic causes: Secondary | ICD-10-CM | POA: Diagnosis not present

## 2020-09-06 DIAGNOSIS — K080 Exfoliation of teeth due to systemic causes: Secondary | ICD-10-CM | POA: Diagnosis not present

## 2021-01-11 DIAGNOSIS — K080 Exfoliation of teeth due to systemic causes: Secondary | ICD-10-CM | POA: Diagnosis not present

## 2021-03-22 DIAGNOSIS — H40023 Open angle with borderline findings, high risk, bilateral: Secondary | ICD-10-CM | POA: Diagnosis not present

## 2021-03-29 DIAGNOSIS — H40023 Open angle with borderline findings, high risk, bilateral: Secondary | ICD-10-CM | POA: Diagnosis not present

## 2021-03-29 DIAGNOSIS — H2513 Age-related nuclear cataract, bilateral: Secondary | ICD-10-CM | POA: Diagnosis not present

## 2021-05-24 DIAGNOSIS — K080 Exfoliation of teeth due to systemic causes: Secondary | ICD-10-CM | POA: Diagnosis not present

## 2021-08-15 DIAGNOSIS — J020 Streptococcal pharyngitis: Secondary | ICD-10-CM | POA: Diagnosis not present
